# Patient Record
Sex: Female | Born: 1945 | Race: Black or African American | Hispanic: No | Smoking: Never smoker
Health system: Southern US, Community
[De-identification: ages and names within clinical notes are randomized; demographics above are authoritative.]

## PROBLEM LIST (undated history)

## (undated) DIAGNOSIS — G4733 Obstructive sleep apnea (adult) (pediatric): Secondary | ICD-10-CM

## (undated) DIAGNOSIS — R51 Headache: Secondary | ICD-10-CM

## (undated) DIAGNOSIS — G5603 Carpal tunnel syndrome, bilateral upper limbs: Secondary | ICD-10-CM

## (undated) DIAGNOSIS — G8929 Other chronic pain: Secondary | ICD-10-CM

## (undated) DIAGNOSIS — M8580 Other specified disorders of bone density and structure, unspecified site: Secondary | ICD-10-CM

## (undated) DIAGNOSIS — H5711 Ocular pain, right eye: Secondary | ICD-10-CM

## (undated) DIAGNOSIS — G4730 Sleep apnea, unspecified: Secondary | ICD-10-CM

## (undated) DIAGNOSIS — R7303 Prediabetes: Secondary | ICD-10-CM

## (undated) DIAGNOSIS — J302 Other seasonal allergic rhinitis: Secondary | ICD-10-CM

## (undated) DIAGNOSIS — I10 Essential (primary) hypertension: Secondary | ICD-10-CM

## (undated) DIAGNOSIS — E785 Hyperlipidemia, unspecified: Secondary | ICD-10-CM

## (undated) DIAGNOSIS — Z973 Presence of spectacles and contact lenses: Secondary | ICD-10-CM

## (undated) DIAGNOSIS — R519 Headache, unspecified: Secondary | ICD-10-CM

## (undated) DIAGNOSIS — E7800 Pure hypercholesterolemia, unspecified: Secondary | ICD-10-CM

## (undated) DIAGNOSIS — G500 Trigeminal neuralgia: Secondary | ICD-10-CM

## (undated) DIAGNOSIS — K589 Irritable bowel syndrome without diarrhea: Secondary | ICD-10-CM

## (undated) DIAGNOSIS — G5600 Carpal tunnel syndrome, unspecified upper limb: Secondary | ICD-10-CM

## (undated) DIAGNOSIS — K219 Gastro-esophageal reflux disease without esophagitis: Secondary | ICD-10-CM

## (undated) DIAGNOSIS — N950 Postmenopausal bleeding: Secondary | ICD-10-CM

## (undated) DIAGNOSIS — N840 Polyp of corpus uteri: Secondary | ICD-10-CM

## (undated) HISTORY — PX: COLONOSCOPY: SHX174

## (undated) HISTORY — DX: Irritable bowel syndrome, unspecified: K58.9

## (undated) HISTORY — DX: Carpal tunnel syndrome, unspecified upper limb: G56.00

## (undated) HISTORY — DX: Headache: R51

## (undated) HISTORY — DX: Other specified disorders of bone density and structure, unspecified site: M85.80

## (undated) HISTORY — DX: Gastro-esophageal reflux disease without esophagitis: K21.9

## (undated) HISTORY — DX: Ocular pain, right eye: H57.11

## (undated) HISTORY — DX: Trigeminal neuralgia: G50.0

## (undated) HISTORY — PX: CHOLECYSTECTOMY: SHX55

## (undated) HISTORY — DX: Headache, unspecified: R51.9

## (undated) HISTORY — DX: Pure hypercholesterolemia, unspecified: E78.00

## (undated) HISTORY — DX: Prediabetes: R73.03

## (undated) HISTORY — DX: Sleep apnea, unspecified: G47.30

---

## 1985-07-02 HISTORY — PX: BREAST EXCISIONAL BIOPSY: SUR124

## 1998-05-13 ENCOUNTER — Ambulatory Visit (HOSPITAL_COMMUNITY): Admission: RE | Admit: 1998-05-13 | Discharge: 1998-05-13 | Payer: Self-pay | Admitting: Gastroenterology

## 1998-08-17 ENCOUNTER — Ambulatory Visit (HOSPITAL_COMMUNITY): Admission: RE | Admit: 1998-08-17 | Discharge: 1998-08-17 | Payer: Self-pay | Admitting: Gastroenterology

## 1998-08-17 ENCOUNTER — Encounter: Payer: Self-pay | Admitting: Gastroenterology

## 1999-09-22 ENCOUNTER — Encounter: Payer: Self-pay | Admitting: Obstetrics and Gynecology

## 1999-09-22 ENCOUNTER — Encounter: Admission: RE | Admit: 1999-09-22 | Discharge: 1999-09-22 | Payer: Self-pay | Admitting: Obstetrics and Gynecology

## 2000-07-19 ENCOUNTER — Other Ambulatory Visit: Admission: RE | Admit: 2000-07-19 | Discharge: 2000-07-19 | Payer: Self-pay | Admitting: Obstetrics and Gynecology

## 2000-07-22 ENCOUNTER — Other Ambulatory Visit: Admission: RE | Admit: 2000-07-22 | Discharge: 2000-07-22 | Payer: Self-pay | Admitting: Obstetrics and Gynecology

## 2001-03-24 ENCOUNTER — Encounter: Payer: Self-pay | Admitting: Obstetrics and Gynecology

## 2001-03-24 ENCOUNTER — Encounter: Admission: RE | Admit: 2001-03-24 | Discharge: 2001-03-24 | Payer: Self-pay | Admitting: Obstetrics and Gynecology

## 2002-04-17 ENCOUNTER — Other Ambulatory Visit: Admission: RE | Admit: 2002-04-17 | Discharge: 2002-04-17 | Payer: Self-pay | Admitting: Obstetrics and Gynecology

## 2002-04-17 ENCOUNTER — Encounter: Payer: Self-pay | Admitting: Obstetrics and Gynecology

## 2002-04-17 ENCOUNTER — Encounter: Admission: RE | Admit: 2002-04-17 | Discharge: 2002-04-17 | Payer: Self-pay | Admitting: Obstetrics and Gynecology

## 2003-06-01 ENCOUNTER — Encounter: Admission: RE | Admit: 2003-06-01 | Discharge: 2003-06-01 | Payer: Self-pay | Admitting: Obstetrics and Gynecology

## 2003-06-15 ENCOUNTER — Other Ambulatory Visit: Admission: RE | Admit: 2003-06-15 | Discharge: 2003-06-15 | Payer: Self-pay | Admitting: Obstetrics and Gynecology

## 2004-04-24 ENCOUNTER — Ambulatory Visit (HOSPITAL_BASED_OUTPATIENT_CLINIC_OR_DEPARTMENT_OTHER): Admission: RE | Admit: 2004-04-24 | Discharge: 2004-04-24 | Payer: Self-pay | Admitting: Family Medicine

## 2004-05-16 ENCOUNTER — Ambulatory Visit (HOSPITAL_BASED_OUTPATIENT_CLINIC_OR_DEPARTMENT_OTHER): Admission: RE | Admit: 2004-05-16 | Discharge: 2004-05-16 | Payer: Self-pay | Admitting: Family Medicine

## 2004-06-13 ENCOUNTER — Ambulatory Visit (HOSPITAL_COMMUNITY): Admission: RE | Admit: 2004-06-13 | Discharge: 2004-06-13 | Payer: Self-pay | Admitting: Gastroenterology

## 2004-06-23 ENCOUNTER — Ambulatory Visit (HOSPITAL_COMMUNITY): Admission: RE | Admit: 2004-06-23 | Discharge: 2004-06-23 | Payer: Self-pay | Admitting: Obstetrics and Gynecology

## 2004-07-20 ENCOUNTER — Other Ambulatory Visit: Admission: RE | Admit: 2004-07-20 | Discharge: 2004-07-20 | Payer: Self-pay | Admitting: Obstetrics and Gynecology

## 2005-02-26 ENCOUNTER — Emergency Department (HOSPITAL_COMMUNITY): Admission: EM | Admit: 2005-02-26 | Discharge: 2005-02-26 | Payer: Self-pay | Admitting: Emergency Medicine

## 2005-07-12 ENCOUNTER — Ambulatory Visit (HOSPITAL_COMMUNITY): Admission: RE | Admit: 2005-07-12 | Discharge: 2005-07-12 | Payer: Self-pay | Admitting: Obstetrics and Gynecology

## 2005-10-12 ENCOUNTER — Encounter: Admission: RE | Admit: 2005-10-12 | Discharge: 2005-10-12 | Payer: Self-pay | Admitting: Family Medicine

## 2006-07-19 ENCOUNTER — Ambulatory Visit (HOSPITAL_COMMUNITY): Admission: RE | Admit: 2006-07-19 | Discharge: 2006-07-19 | Payer: Self-pay | Admitting: Obstetrics and Gynecology

## 2007-07-21 ENCOUNTER — Ambulatory Visit (HOSPITAL_COMMUNITY): Admission: RE | Admit: 2007-07-21 | Discharge: 2007-07-21 | Payer: Self-pay | Admitting: Obstetrics and Gynecology

## 2008-07-23 ENCOUNTER — Ambulatory Visit (HOSPITAL_COMMUNITY): Admission: RE | Admit: 2008-07-23 | Discharge: 2008-07-23 | Payer: Self-pay | Admitting: Obstetrics and Gynecology

## 2008-10-10 ENCOUNTER — Encounter: Admission: RE | Admit: 2008-10-10 | Discharge: 2008-10-10 | Payer: Self-pay | Admitting: Family Medicine

## 2009-08-22 ENCOUNTER — Ambulatory Visit (HOSPITAL_COMMUNITY): Admission: RE | Admit: 2009-08-22 | Discharge: 2009-08-22 | Payer: Self-pay | Admitting: Obstetrics and Gynecology

## 2010-01-09 ENCOUNTER — Observation Stay (HOSPITAL_COMMUNITY): Admission: EM | Admit: 2010-01-09 | Discharge: 2010-01-10 | Payer: Self-pay | Admitting: Emergency Medicine

## 2010-01-09 HISTORY — PX: CHOLECYSTECTOMY, LAPAROSCOPIC: SHX56

## 2010-06-28 ENCOUNTER — Other Ambulatory Visit
Admission: RE | Admit: 2010-06-28 | Discharge: 2010-06-28 | Payer: Self-pay | Source: Home / Self Care | Admitting: Family Medicine

## 2010-07-28 ENCOUNTER — Other Ambulatory Visit (HOSPITAL_COMMUNITY): Payer: Self-pay | Admitting: Family Medicine

## 2010-07-28 DIAGNOSIS — Z1231 Encounter for screening mammogram for malignant neoplasm of breast: Secondary | ICD-10-CM

## 2010-07-28 DIAGNOSIS — Z0000 Encounter for general adult medical examination without abnormal findings: Secondary | ICD-10-CM

## 2010-08-24 ENCOUNTER — Ambulatory Visit (HOSPITAL_COMMUNITY)
Admission: RE | Admit: 2010-08-24 | Discharge: 2010-08-24 | Disposition: A | Discharge status: Home / Self Care | Payer: Medicare Other | Source: Ambulatory Visit | Attending: Family Medicine | Admitting: Family Medicine

## 2010-08-24 DIAGNOSIS — Z1231 Encounter for screening mammogram for malignant neoplasm of breast: Secondary | ICD-10-CM

## 2010-09-17 LAB — URINALYSIS, ROUTINE W REFLEX MICROSCOPIC
Glucose, UA: NEGATIVE mg/dL
Nitrite: NEGATIVE
Specific Gravity, Urine: 1.023 (ref 1.005–1.030)
Urobilinogen, UA: 1 mg/dL (ref 0.0–1.0)

## 2010-09-17 LAB — URINE CULTURE

## 2010-09-17 LAB — COMPREHENSIVE METABOLIC PANEL
ALT: 14 U/L (ref 0–35)
AST: 18 U/L (ref 0–37)
Albumin: 3.6 g/dL (ref 3.5–5.2)
Alkaline Phosphatase: 73 U/L (ref 39–117)
Calcium: 9.4 mg/dL (ref 8.4–10.5)
GFR calc Af Amer: >60 mL/min (ref >60)
GFR calc non Af Amer: 56 mL/min — ABNORMAL LOW (ref >60)
Potassium: 3.4 mEq/L — ABNORMAL LOW (ref 3.5–5.1)
Sodium: 136 mEq/L (ref 135–145)
Total Bilirubin: 0.8 mg/dL (ref 0.3–1.2)
Total Protein: 7.2 g/dL (ref 6.0–8.3)

## 2010-09-17 LAB — CBC
Hemoglobin: 14.2 g/dL (ref 12.0–15.0)
MCHC: 34.8 g/dL (ref 30.0–36.0)
Platelets: 244 10*3/uL (ref 150–400)
RDW: 13.8 % (ref 11.5–15.5)

## 2010-09-17 LAB — DIFFERENTIAL
Basophils Absolute: 0 10*3/uL (ref 0.0–0.1)
Basophils Relative: 0 % (ref 0–1)
Eosinophils Absolute: 0 10*3/uL (ref 0.0–0.7)
Lymphocytes Relative: 11 % — ABNORMAL LOW (ref 12–46)

## 2010-09-17 LAB — LIPASE, BLOOD: Lipase: 23 U/L (ref 11–59)

## 2010-09-17 LAB — URINE MICROSCOPIC-ADD ON

## 2010-11-17 NOTE — Op Note (Signed)
NAMEJUANETTE, Kathryn Guerrero             ACCOUNT NO.:  [id number]   MEDICAL RECORD NO.:  [id number]          PATIENT TYPE:  AMB   LOCATION:  ENDO                         FACILITY:  [facility]   PHYSICIAN:  John C. Madilyn Fireman, M.D.    DATE OF BIRTH:  January 05, 1946   DATE OF PROCEDURE:  06/13/2004  DATE OF DISCHARGE:                                 OPERATIVE REPORT   PROCEDURE:  Esophagogastroduodenoscopy.   INDICATION FOR PROCEDURE:  Description of reflux symptoms, left upper  quadrant pain, and nausea in a patient who is also undergoing screening  colonoscopy today.   DESCRIPTION OF PROCEDURE:  The patient was placed in the left lateral  decubitus position and placed on the pulse monitor with continuous low flow  oxygen delivered by nasal cannula.  She was sedated with 50 mcg IV fentanyl  and 6 mg IV Versed.  The Olympus video endoscope was advanced under direct  vision to the oropharynx and esophagus.  The esophagus was straight and of  normal caliber.  At the squamocolumnar line at 38 cm, no visible hiatal  ring, stricture, or other abnormality of the GE junction.  The stomach was  entered and a small amount of liquid secretion was suctioned from the  fundus.  Retroflexed view of the cardia was unremarkable.  The fundus, body,  antrum, and pylorus all appeared normal.  The duodenum was entered and both  the bulb and second portion were well inspected and appeared to be within  normal limits.  The scope was withdrawn back into the stomach and a CLO test  obtained.  The scope was then withdrawn and the patient prepared for  colonoscopy.  She tolerated the procedure well and there were no immediate  complications.   IMPRESSION:  Normal study.   PLAN:  Will proceed with colonoscopy.      JCH/MEDQ  D:  06/13/2004  T:  06/13/2004  Job:  [id number]   cc:   Jethro Bastos, M.D.  [address]  [place]  [place] [address]  Fax: [phone]

## 2010-11-17 NOTE — Procedures (Signed)
Kathryn Guerrero, Kathryn Guerrero             ACCOUNT NO.:  [id number]   MEDICAL RECORD NO.:  [id number]          PATIENT TYPE:  OUT   LOCATION:  SLEEP CENTER                 FACILITY:  [facility]   PHYSICIAN:  Clinton D. Maple Hudson, M.D. DATE OF BIRTH:  October 23, 1945   DATE OF STUDY:  05/16/2004                              NOCTURNAL POLYSOMNOGRAM   REFERRING PHYSICIAN:  Jethro Bastos, M.D.   INDICATION FOR STUDY AND HISTORY:  Hypersomnia with sleep apnea.  Scheduled  for CPAP titration.  Diagnostic NPSG April 24, 2004, recorded an RDI of 36  obstructive events per hour with desaturation to 72%.   SLEEP ARCHITECTURE:  Total sleep time 342 minutes with sleep efficiency of  87%.  Stage I was 2%; stage II, 43%; Stages III and IV 19%.  REM was 35% of  total sleep time.  Latency to sleep onset 15 minutes, latency to REM 128  minutes.  Awake after sleep onset 38 minutes.  Arousal index 12.4.   RESPIRATORY DATA:  CPAP titration protocol.  CPAP was titrated to 9 CWP, RDI  3.2 per hour using a small Comfort Gel mask with heated humidifier.   OXYGEN DATA:  Mild snoring before CPAP with oxygen desaturation to a nadir  of 82%.  After CPAP titration, snoring was prevented, and oxygen saturation  held 95%.   CARDIAC DATA:  Normal sinus rhythm.   MOVEMENT AND PARASOMNIA:  Occasional leg jerk, insignificant.   IMPRESSION AND RECOMMENDATIONS:  1.  Successful CPAP titration to 9 CWP, RDI 3.2 per hour using a small      Comfort Gel mask with heated humidifier.  2.  Previously diagnosed with obstructive sleep apnea based on NPSG April 24, 2004, recording an RDI of 36 per hour with desaturation to 72%.   Clinton D. Maple Hudson, M.D.  Diplomate, Biomedical engineer of Sleep Medicine                                                           Clinton D. Maple Hudson, M.D.  Diplomate, [employer] Board   CDY/MEDQ  D:  05/21/2004 10:25:23  T:  05/21/2004 10:53:35  Job:  [id number]   cc:   Jethro Bastos, M.D.  [address]  [place]  [place] [address]  Fax: [phone]

## 2010-11-17 NOTE — Procedures (Signed)
Kathryn Guerrero, Kathryn Guerrero             ACCOUNT NO.:  [id number]   MEDICAL RECORD NO.:  [id number]          PATIENT TYPE:  OUT   LOCATION:  SLEEP CENTER                 FACILITY[facility]  [facility]   PHYSICIAN:  Clinton D. Maple Hudson, M.D. DATE OF BIRTH:  Aug 14, 1945   DATE OF STUDY:  04/24/2004                              NOCTURNAL POLYSOMNOGRAM   REFERRING PHYSICIAN:  Jethro Bastos, M.D.   INDICATION FOR STUDY AND HISTORY:  Hypersomnia with sleep apnea.   Epworth sleepiness score 11/24, neck size 14 inches, BMI 35, weight 195  pounds.   SLEEP ARCHITECTURE:  Total sleep time 361 minutes with sleep efficiency of  83%.  Stage I was 6%; stage II, 53%; Stages III and IV 13%.  REM was 28% of  total sleep time.  Latency to sleep onset 32 minutes, latency to REM 73  minutes.  Awake after sleep onset 42 minute.  Arousal index 34, which is  increased and mostly related to respiratory events.   RESPIRATORY DATA:  NPSG protocol.  RDI 36.6 per hour which indicates  moderately severe obstructive sleep apnea/hypopnea syndrome.  This included  69 obstructive apneas and 151 hypopneas.  Events were not positional.  REM  RDI  was 70 per hour.   OXYGEN DATA:  Moderate to loud snoring with severe oxygen desaturation to a  nadir of 72% with apneas.  Mean saturation otherwise was 90 to 91% on room  air.   CARDIAC DATA:  Normal cardiac rhythm.   MOVEMENT AND PARASOMNIA:  A total of 81 limb jerks were recorded of which 15  were associated with arousal or awakening for a periodic limb movement with  arousal index of 2.5 per hour which is mildly increased.   IMPRESSION AND RECOMMENDATIONS:  1.  Moderately severe obstructive sleep apnea/hypopnea syndrome, RDI 36.6      per hour with desaturation to 72%.  2.  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  3.  Consider return for CPAP titration or evaluation for alternative care if      appropriate.   Clinton D. Maple Hudson, M.D.  Diplomate, Biomedical engineer of Sleep  Medicine                                                          Clinton D. Maple Hudson, M.D.  Diplomate, [employer] Board  CDY/MEDQ  D:  04/30/2004 08:55:27  T:  04/30/2004 09:57:34  Job:  [id number]

## 2010-11-17 NOTE — Op Note (Signed)
NAMEMICKIE, Kathryn Guerrero             ACCOUNT NO.:  [id number]   MEDICAL RECORD NO.:  [id number]          PATIENT TYPE:  AMB   LOCATION:  ENDO                         FACILITY:  [facility]   PHYSICIAN:  John C. Madilyn Fireman, M.D.    DATE OF BIRTH:  May 21, 1946   DATE OF PROCEDURE:  06/13/2004  DATE OF DISCHARGE:                                 OPERATIVE REPORT   PROCEDURE:  Colonoscopy.   INDICATION FOR PROCEDURE:  Average risk colon cancer screening.   DESCRIPTION OF PROCEDURE:  The patient was placed in the left lateral  decubitus position and placed on the pulse monitor with continuous low flow  oxygen delivered by nasal cannula.  She was sedated with 37.5 mcg IV  fentanyl and 3 mg IV Versed.  The Olympus video colonoscope was inserted  into the rectum and advanced as far as possible but the scope reached only  about the mid ascending colon despite multiple position changes, torque  maneuvers, and abdominal pressure.  The ileocecal valve was seen at a  distance but the base of the cecum was not reached.  The visualized portions  of the ileocecal valve and ascending colon appeared normal, as did the  transverse, descending, sigmoid, and rectum.  The scope was then withdrawn  and the patient returned to the recovery room in stable condition.  He  tolerated the procedure well and there were no immediate complications.   IMPRESSION:  Normal study with inability to reach the base of the cecum and  view only to the ileocecal valve.   PLAN:  Will obtain Hemoccults in a few weeks.  If positive, will probably  follow up with a BE.      JCH/MEDQ  D:  06/13/2004  T:  06/13/2004  Job:  [id number]   cc:   Jethro Bastos, M.D.  [address]  [place]  [place] [address]  Fax: [phone]

## 2011-07-27 ENCOUNTER — Other Ambulatory Visit (HOSPITAL_COMMUNITY): Payer: Self-pay | Admitting: Family Medicine

## 2011-07-27 DIAGNOSIS — Z1231 Encounter for screening mammogram for malignant neoplasm of breast: Secondary | ICD-10-CM

## 2011-08-28 ENCOUNTER — Ambulatory Visit (HOSPITAL_COMMUNITY)
Admission: RE | Admit: 2011-08-28 | Discharge: 2011-08-28 | Disposition: A | Discharge status: Home / Self Care | Payer: Medicare Other | Source: Ambulatory Visit | Attending: Family Medicine | Admitting: Family Medicine

## 2011-08-28 DIAGNOSIS — Z1231 Encounter for screening mammogram for malignant neoplasm of breast: Secondary | ICD-10-CM | POA: Insufficient documentation

## 2012-02-05 ENCOUNTER — Emergency Department (HOSPITAL_COMMUNITY): Payer: Medicare Other

## 2012-02-05 ENCOUNTER — Encounter (HOSPITAL_COMMUNITY): Payer: Self-pay | Admitting: Emergency Medicine

## 2012-02-05 ENCOUNTER — Emergency Department (HOSPITAL_COMMUNITY)
Admission: EM | Admit: 2012-02-05 | Discharge: 2012-02-05 | Disposition: A | Discharge status: Home / Self Care | Payer: Medicare Other | Attending: Emergency Medicine | Admitting: Emergency Medicine

## 2012-02-05 DIAGNOSIS — I10 Essential (primary) hypertension: Secondary | ICD-10-CM | POA: Insufficient documentation

## 2012-02-05 DIAGNOSIS — Z79899 Other long term (current) drug therapy: Secondary | ICD-10-CM | POA: Insufficient documentation

## 2012-02-05 DIAGNOSIS — R42 Dizziness and giddiness: Secondary | ICD-10-CM

## 2012-02-05 DIAGNOSIS — R112 Nausea with vomiting, unspecified: Secondary | ICD-10-CM | POA: Insufficient documentation

## 2012-02-05 HISTORY — DX: Essential (primary) hypertension: I10

## 2012-02-05 LAB — URINALYSIS, ROUTINE W REFLEX MICROSCOPIC
Glucose, UA: NEGATIVE mg/dL
Specific Gravity, Urine: 1.017 (ref 1.005–1.030)
pH: 6.5 (ref 5.0–8.0)

## 2012-02-05 LAB — BASIC METABOLIC PANEL
BUN: 13 mg/dL (ref 6–23)
Calcium: 9.9 mg/dL (ref 8.4–10.5)
Chloride: 98 mEq/L (ref 96–112)
Creatinine, Ser: 0.97 mg/dL (ref 0.50–1.10)
Glucose, Bld: 135 mg/dL — ABNORMAL HIGH (ref 70–99)
Potassium: 3.5 mEq/L (ref 3.5–5.1)

## 2012-02-05 LAB — CBC WITH DIFFERENTIAL/PLATELET
Eosinophils Absolute: 0.1 10*3/uL (ref 0.0–0.7)
Eosinophils Relative: 1 % (ref 0–5)
Lymphocytes Relative: 18 % (ref 12–46)
Lymphs Abs: 2.2 10*3/uL (ref 0.7–4.0)
MCH: 31 pg (ref 26.0–34.0)
MCHC: 34.4 g/dL (ref 30.0–36.0)
MCV: 90.4 fL (ref 78.0–100.0)
Monocytes Relative: 5 % (ref 3–12)
Neutrophils Relative %: 77 % (ref 43–77)
RDW: 13.6 % (ref 11.5–15.5)
WBC: 12.1 10*3/uL — ABNORMAL HIGH (ref 4.0–10.5)

## 2012-02-05 LAB — URINE MICROSCOPIC-ADD ON

## 2012-02-05 MED ORDER — SODIUM CHLORIDE 0.9 % IV BOLUS (SEPSIS)
250.0000 mL | Freq: Once | INTRAVENOUS | Status: AC
  Administered 2012-02-05: 250 mL via INTRAVENOUS

## 2012-02-05 MED ORDER — SODIUM CHLORIDE 0.9 % IV SOLN
INTRAVENOUS | Status: DC
  Administered 2012-02-05: 20:00:00 via INTRAVENOUS

## 2012-02-05 MED ORDER — LORAZEPAM 1 MG PO TABS
1.0000 mg | ORAL_TABLET | Freq: Once | ORAL | Status: AC
  Administered 2012-02-05: 1 mg via ORAL
  Filled 2012-02-05: qty 1

## 2012-02-05 MED ORDER — MECLIZINE HCL 50 MG PO TABS: 25.0000 mg | ORAL_TABLET | Freq: Three times a day (TID) | ORAL | Status: AC | PRN

## 2012-02-05 MED ORDER — ONDANSETRON 4 MG PO TBDP
8.0000 mg | ORAL_TABLET | Freq: Once | ORAL | Status: AC
  Administered 2012-02-05: 8 mg via ORAL
  Filled 2012-02-05: qty 2

## 2012-02-05 NOTE — ED Notes (Signed)
Pt ambulated to restroom with no assistance. ?

## 2012-02-05 NOTE — ED Notes (Signed)
The pt reports she feel better.  C/o being cold blankets given room temp increased

## 2012-02-05 NOTE — ED Provider Notes (Addendum)
MSE was initiated and I personally evaluated the patient and placed orders (if any) at  4:55 PM on February 05, 2012.  The remainder of the MSE may be completed by another ED provider.  No CP now or recently. C/o dizziness, non-vertiginous. Feels, "off-balance and light-headed." No nystagmus. Normal strength. Treatment and evaluation tests ordered.  Flint Melter, MD 02/05/12 1657   Medical screening examination/treatment/procedure(s) were conducted as a shared visit with non-physician practitioner(s) and myself.  I personally evaluated the patient during the encounter  Flint Melter, MD 02/16/12 1341

## 2012-02-05 NOTE — ED Notes (Signed)
Pt c/o lightheadedness and nausea with left ear roaring and pressure x 3 days; pt noted HR 48-50bpm

## 2012-02-05 NOTE — ED Provider Notes (Deleted)
History     CSN: [id number]  Arrival date & time 02/05/12  1623   First MD Initiated Contact with Patient 02/05/12 1801      Chief Complaint  Patient presents with  . Dizziness  . Nausea    (Consider location/radiation/quality/duration/timing/severity/associated sxs/prior treatment) HPI  Past Medical History  Diagnosis Date  . Hypertension     History reviewed. No pertinent past surgical history.  History reviewed. No pertinent family history.  History  Substance Use Topics  . Smoking status: Never Smoker   . Smokeless tobacco: Not on file  . Alcohol Use: No    OB History    Grav Para Term Preterm Abortions TAB SAB Ect Mult Living                  Review of Systems  Allergies  Vicodin  Home Medications   Current Outpatient Rx  Name Route Sig Dispense Refill  . BIOTIN 1000 MCG PO TABS Oral Take 1,000 mcg by mouth daily.    Marland Kitchen CALCIUM CITRATE MALATE-VIT D PO Oral Take 1 tablet by mouth 2 (two) times daily.    . B-12 PO Oral Take 1 tablet by mouth daily.    Marland Kitchen HYDROCHLOROTHIAZIDE 25 MG PO TABS Oral Take 25 mg by mouth daily.    Marland Kitchen LOSARTAN POTASSIUM 25 MG PO TABS Oral Take 25 mg by mouth daily.    . MOMETASONE FUROATE 50 MCG/ACT NA SUSP Nasal Place 2 sprays into the nose daily.    . ADULT MULTIVITAMIN W/MINERALS CH Oral Take 1 tablet by mouth daily.    Marland Kitchen OMEPRAZOLE 20 MG PO CPDR Oral Take 20 mg by mouth daily.    Marland Kitchen POTASSIUM CHLORIDE CRYS ER 20 MEQ PO TBCR Oral Take 20 mEq by mouth daily.      BP 149/83  Pulse 69  Temp 97.8 F (36.6 C) (Oral)  Resp 18  SpO2 96%  Physical Exam  ED Course  Procedures (including critical care time)  Results for orders placed during the hospital encounter of 02/05/12  CBC WITH DIFFERENTIAL      Component Value Range   WBC 12.1 (*) 4.0 - 10.5 K/uL   RBC 4.67  3.87 - 5.11 MIL/uL   Hemoglobin 14.5  12.0 - 15.0 g/dL   HCT 95.6  21.3 - 08.6 %   MCV 90.4  78.0 - 100.0 fL   MCH 31.0  26.0 - 34.0 pg   MCHC 34.4  30.0 -  36.0 g/dL   RDW 57.8  46.9 - 62.9 %   Platelets 208  150 - 400 K/uL   Neutrophils Relative 77  43 - 77 %   Neutro Abs 9.2 (*) 1.7 - 7.7 K/uL   Lymphocytes Relative 18  12 - 46 %   Lymphs Abs 2.2  0.7 - 4.0 K/uL   Monocytes Relative 5  3 - 12 %   Monocytes Absolute 0.6  0.1 - 1.0 K/uL   Eosinophils Relative 1  0 - 5 %   Eosinophils Absolute 0.1  0.0 - 0.7 K/uL   Basophils Relative 0  0 - 1 %   Basophils Absolute 0.0  0.0 - 0.1 K/uL  BASIC METABOLIC PANEL      Component Value Range   Sodium 137  135 - 145 mEq/L   Potassium 3.5  3.5 - 5.1 mEq/L   Chloride 98  96 - 112 mEq/L   CO2 28  19 - 32 mEq/L   Glucose, Bld 135 (*)  70 - 99 mg/dL   BUN 13  6 - 23 mg/dL   Creatinine, Ser 1.30  0.50 - 1.10 mg/dL   Calcium 9.9  8.4 - 86.5 mg/dL   GFR calc non Af Amer 60 (*) >90 mL/min   GFR calc Af Amer 69 (*) >90 mL/min  TROPONIN I      Component Value Range   Troponin I <0.30  <0.30 ng/mL     Dg Chest 2 View  02/05/2012  *RADIOLOGY REPORT*  Clinical Data: Dizziness, nausea for 3 days  CHEST - 2 VIEW  Comparison: None  Findings: The lungs are clear.  Mediastinal contours appear normal. The heart is within upper limits of normal.  No acute bony abnormality is seen with only mild degenerative change in the thoracic spine.  IMPRESSION: No active lung disease.  Original Report Authenticated By: Juline Patch, M.D.   7:16 PM BP 149/83  Pulse 69  Temp 97.8 F (36.6 C) (Oral)  Resp 18  SpO2 96% Patient seen and evaluated. Will order orthostatic VS.  Patient is neurologically intact. No nystagmus, papilledema , or HA indicating intracranial etiology.  Elevated white count.  Patient with abnormal EKG-  First troponin negative.  I believe patients dizziness is related to  [facility] ear dsyfunction.    7:44 PM  Orthostatics are negative.  Patient seen and evaluated. States that she is feeling a bit dizzy again, no nausea.  I will have her get her second bolus and have her ambulate.  If she is stable I  will d/c with anti nausea meds, and have her follow up with ENT.   Patient states that she is no longer dizzy or nauseated.  Patient's vitals are stable and she is able to ambulate unassisted. i will d/c patient home with ENT follow up. BP 143/104  Pulse 56  Temp 97.8 F (36.6 C) (Oral)  Resp 19  SpO2 99%   1. Episodic lightheadedness   2. Nausea & vomiting       MDM  D/C patient home with antivert and ENT follow up. Discussed reasons to seek immediate care. Patient expresses understanding and agrees with plan.         Arthor Captain, PA-C 02/06/12 1331

## 2012-02-05 NOTE — ED Notes (Signed)
Pt sitting up in bed, A&Ox4, talking with pharmacy tech, warm skin, unlabored and equal respirations.  Pt reports she has been feeling lightheaded and nauseous intermittently since Sunday. Pt also c/o a constant "roaring" sound and pressure in left ear since Sunday.

## 2012-02-06 ENCOUNTER — Telehealth (HOSPITAL_COMMUNITY): Payer: Self-pay | Admitting: *Deleted

## 2012-02-06 NOTE — ED Provider Notes (Signed)
This is an addendum to my Note on Kathryn Guerrero In reviewing her chart this morning I realized that I may have missed a possible UTI reflected in UA with leukocytosis and HGb Postitve, rare bacteria and nitrite negative.  CBC with mild leukocytosis and elevated ANC. Patient was without urinary complaints and no AMS.  I spoke with Kenney Houseman the Nurse flow manager this afternoon (02/06/2012) and had the ED contact her do follow up with her PCP today to get a UA and culture.  I did not prescribe abx b/c I would not have the culture and sensitivity to follow up on. I Felt this would be better handled by her PCP. Patient was also infoermed that she may return to the ED. Arthor Captain. 2:02 PM   Arthor Captain, PA-C 02/06/12 1402

## 2012-02-06 NOTE — ED Provider Notes (Deleted)
History     CSN: [id number]  Arrival date & time 02/05/12  1623   First MD Initiated Contact with Patient 02/05/12 1801      Chief Complaint  Patient presents with  . Dizziness  . Nausea    (Consider location/radiation/quality/duration/timing/severity/associated sxs/prior treatment) The history is provided by the patient. No language interpreter was used.    Past Medical History  Diagnosis Date  . Hypertension     History reviewed. No pertinent past surgical history.  History reviewed. No pertinent family history.  History  Substance Use Topics  . Smoking status: Never Smoker   . Smokeless tobacco: Not on file  . Alcohol Use: No    OB History    Grav Para Term Preterm Abortions TAB SAB Ect Mult Living                  Review of Systems  Constitutional: Negative for chills.    Allergies  Vicodin  Home Medications   Current Outpatient Rx  Name Route Sig Dispense Refill  . BIOTIN 1000 MCG PO TABS Oral Take 1,000 mcg by mouth daily.    Marland Kitchen CALCIUM CITRATE MALATE-VIT D PO Oral Take 1 tablet by mouth 2 (two) times daily.    . B-12 PO Oral Take 1 tablet by mouth daily.    Marland Kitchen HYDROCHLOROTHIAZIDE 25 MG PO TABS Oral Take 25 mg by mouth daily.    Marland Kitchen LOSARTAN POTASSIUM 25 MG PO TABS Oral Take 25 mg by mouth daily.    . MOMETASONE FUROATE 50 MCG/ACT NA SUSP Nasal Place 2 sprays into the nose daily.    . ADULT MULTIVITAMIN W/MINERALS CH Oral Take 1 tablet by mouth daily.    Marland Kitchen OMEPRAZOLE 20 MG PO CPDR Oral Take 20 mg by mouth daily.    Marland Kitchen POTASSIUM CHLORIDE CRYS ER 20 MEQ PO TBCR Oral Take 20 mEq by mouth daily.      BP 149/83  Pulse 69  Temp 97.8 F (36.6 C) (Oral)  Resp 18  SpO2 96%  Physical Exam  ED Course  Procedures (including critical care time)  Results for orders placed during the hospital encounter of 02/05/12  CBC WITH DIFFERENTIAL      Component Value Range   WBC 12.1 (*) 4.0 - 10.5 K/uL   RBC 4.67  3.87 - 5.11 MIL/uL   Hemoglobin 14.5  12.0 -  15.0 g/dL   HCT 08.6  57.8 - 46.9 %   MCV 90.4  78.0 - 100.0 fL   MCH 31.0  26.0 - 34.0 pg   MCHC 34.4  30.0 - 36.0 g/dL   RDW 62.9  52.8 - 41.3 %   Platelets 208  150 - 400 K/uL   Neutrophils Relative 77  43 - 77 %   Neutro Abs 9.2 (*) 1.7 - 7.7 K/uL   Lymphocytes Relative 18  12 - 46 %   Lymphs Abs 2.2  0.7 - 4.0 K/uL   Monocytes Relative 5  3 - 12 %   Monocytes Absolute 0.6  0.1 - 1.0 K/uL   Eosinophils Relative 1  0 - 5 %   Eosinophils Absolute 0.1  0.0 - 0.7 K/uL   Basophils Relative 0  0 - 1 %   Basophils Absolute 0.0  0.0 - 0.1 K/uL  BASIC METABOLIC PANEL      Component Value Range   Sodium 137  135 - 145 mEq/L   Potassium 3.5  3.5 - 5.1 mEq/L   Chloride 98  96 - 112 mEq/L   CO2 28  19 - 32 mEq/L   Glucose, Bld 135 (*) 70 - 99 mg/dL   BUN 13  6 - 23 mg/dL   Creatinine, Ser 6.29  0.50 - 1.10 mg/dL   Calcium 9.9  8.4 - 52.8 mg/dL   GFR calc non Af Amer 60 (*) >90 mL/min   GFR calc Af Amer 69 (*) >90 mL/min  TROPONIN I      Component Value Range   Troponin I <0.30  <0.30 ng/mL     Dg Chest 2 View  02/05/2012  *RADIOLOGY REPORT*  Clinical Data: Dizziness, nausea for 3 days  CHEST - 2 VIEW  Comparison: None  Findings: The lungs are clear.  Mediastinal contours appear normal. The heart is within upper limits of normal.  No acute bony abnormality is seen with only mild degenerative change in the thoracic spine.  IMPRESSION: No active lung disease.  Original Report Authenticated By: Juline Patch, M.D.   7:16 PM BP 149/83  Pulse 69  Temp 97.8 F (36.6 C) (Oral)  Resp 18  SpO2 96% Patient seen and evaluated. Will order orthostatic VS.  Patient is neurologically intact. No nystagmus, papilledema , or HA indicating intracranial etiology.  Elevated white count.  Patient with abnormal EKG-  First troponin negative.  I believe patients dizziness is related to  [facility] ear dsyfunction.    7:44 PM  Orthostatics are negative.  Patient seen and evaluated. States that she is feeling  a bit dizzy again, no nausea.  I will have her get her second bolus and have her ambulate.  If she is stable I will d/c with anti nausea meds, and have her follow up with ENT.   Patient states that she is no longer dizzy or nauseated.  Patient's vitals are stable and she is able to ambulate unassisted. i will d/c patient home with ENT follow up. BP 143/104  Pulse 56  Temp 97.8 F (36.6 C) (Oral)  Resp 19  SpO2 99%   1. Episodic lightheadedness   2. Nausea & vomiting       MDM  D/C patient home with meclizine and ENT follow up. Discussed reasons to seek immediate care. Patient expresses understanding and agrees with plan.         Arthor Captain, PA-C 02/06/12 1330

## 2012-02-10 NOTE — ED Provider Notes (Signed)
History     CSN: [id number]  Arrival date & time 02/05/12  1623   First MD Initiated Contact with Patient 02/05/12 1801      Chief Complaint  Patient presents with  . Dizziness  . Nausea    (Consider location/radiation/quality/duration/timing/severity/associated sxs/prior treatment) HPI Comments: Kathryn Guerrero 66 y.o. female   The chief complaint is: Patient presents with:   Dizziness   Nausea    Patient states that  Sunday,4 days ago she was gardening and had sudden onset of high pitched sound, roaring and fullness in the left ear. Roaring remained for the next 4 days without the other symptoms. Saturday, 3 days ago she had an episode of sudden onset Dizziness with nausea and vomitting.  This lasted approximately one hour. She went to sleep and awoke without symptoms.  These episodes occurred several times over the next 3 days without associated provocation. Patient describes the dizziness as feeling as "feeling like I am going to pass out." and denies sxs of vertigo.  She denies any similar past history. Denies fevers, chills, myalgias, arthralgias, diarrhea. Denies urinary symptoms.She denies palpitations, CP, SOB, orthopnea, recent viral illness, dehydration or history of anemia. She denies any melena or hematochezia.    The history is provided by the patient. No language interpreter was used.    Past Medical History  Diagnosis Date  . Hypertension     History reviewed. No pertinent past surgical history.  History reviewed. No pertinent family history.  History  Substance Use Topics  . Smoking status: Never Smoker   . Smokeless tobacco: Not on file  . Alcohol Use: No    OB History    Grav Para Term Preterm Abortions TAB SAB Ect Mult Living                  Review of Systems  Constitutional: Negative for fever, chills, diaphoresis and fatigue.  HENT: Positive for tinnitus. Negative for hearing loss, nosebleeds, dental problem and sinus pressure.     Respiratory: Negative for cough, chest tightness and shortness of breath.   Cardiovascular: Negative for chest pain, palpitations and leg swelling.  Gastrointestinal: Positive for nausea and vomiting. Negative for abdominal pain, diarrhea and constipation.  Genitourinary: Negative for dysuria, urgency, frequency and flank pain.  Musculoskeletal: Negative for myalgias and arthralgias.  Neurological: Positive for light-headedness. Negative for weakness.    Allergies  Vicodin  Home Medications   Current Outpatient Rx  Name Route Sig Dispense Refill  . BIOTIN 1000 MCG PO TABS Oral Take 1,000 mcg by mouth daily.    Marland Kitchen CALCIUM CITRATE MALATE-VIT D PO Oral Take 1 tablet by mouth 2 (two) times daily.    . B-12 PO Oral Take 1 tablet by mouth daily.    Marland Kitchen HYDROCHLOROTHIAZIDE 25 MG PO TABS Oral Take 25 mg by mouth daily.    Marland Kitchen LOSARTAN POTASSIUM 25 MG PO TABS Oral Take 25 mg by mouth daily.    . MOMETASONE FUROATE 50 MCG/ACT NA SUSP Nasal Place 2 sprays into the nose daily.    . ADULT MULTIVITAMIN W/MINERALS CH Oral Take 1 tablet by mouth daily.    Marland Kitchen OMEPRAZOLE 20 MG PO CPDR Oral Take 20 mg by mouth daily.    Marland Kitchen POTASSIUM CHLORIDE CRYS ER 20 MEQ PO TBCR Oral Take 20 mEq by mouth daily.      BP 149/83  Pulse 69  Temp 97.8 F (36.6 C) (Oral)  Resp 18  SpO2 96%  Physical Exam  Nursing note and vitals reviewed. Constitutional: She is oriented to person, place, and time. She appears well-developed and well-nourished. No distress.  HENT:  Head: Normocephalic and atraumatic.  Eyes: Conjunctivae are normal. No scleral icterus.  Neck: Normal range of motion.  Cardiovascular: Normal rate, regular rhythm and normal heart sounds.  Exam reveals no gallop and no friction rub.   No murmur heard. Pulmonary/Chest: Effort normal and breath sounds normal. No respiratory distress.  Abdominal: Soft. Bowel sounds are normal. She exhibits no distension and no mass. There is no tenderness. There is no  guarding.  Musculoskeletal: Normal range of motion. She exhibits no edema and no tenderness.  Neurological: She is alert and oriented to person, place, and time. She displays normal reflexes. No cranial nerve deficit. She exhibits normal muscle tone. Coordination normal.  Skin: Skin is warm and dry. She is not diaphoretic.    ED Course  Procedures (including critical care time)  Results for orders placed during the hospital encounter of 02/05/12  CBC WITH DIFFERENTIAL      Component Value Range   WBC 12.1 (*) 4.0 - 10.5 K/uL   RBC 4.67  3.87 - 5.11 MIL/uL   Hemoglobin 14.5  12.0 - 15.0 g/dL   HCT 16.1  09.6 - 04.5 %   MCV 90.4  78.0 - 100.0 fL   MCH 31.0  26.0 - 34.0 pg   MCHC 34.4  30.0 - 36.0 g/dL   RDW 40.9  81.1 - 91.4 %   Platelets 208  150 - 400 K/uL   Neutrophils Relative 77  43 - 77 %   Neutro Abs 9.2 (*) 1.7 - 7.7 K/uL   Lymphocytes Relative 18  12 - 46 %   Lymphs Abs 2.2  0.7 - 4.0 K/uL   Monocytes Relative 5  3 - 12 %   Monocytes Absolute 0.6  0.1 - 1.0 K/uL   Eosinophils Relative 1  0 - 5 %   Eosinophils Absolute 0.1  0.0 - 0.7 K/uL   Basophils Relative 0  0 - 1 %   Basophils Absolute 0.0  0.0 - 0.1 K/uL  BASIC METABOLIC PANEL      Component Value Range   Sodium 137  135 - 145 mEq/L   Potassium 3.5  3.5 - 5.1 mEq/L   Chloride 98  96 - 112 mEq/L   CO2 28  19 - 32 mEq/L   Glucose, Bld 135 (*) 70 - 99 mg/dL   BUN 13  6 - 23 mg/dL   Creatinine, Ser 7.82  0.50 - 1.10 mg/dL   Calcium 9.9  8.4 - 95.6 mg/dL   GFR calc non Af Amer 60 (*) >90 mL/min   GFR calc Af Amer 69 (*) >90 mL/min  TROPONIN I      Component Value Range   Troponin I <0.30  <0.30 ng/mL     Dg Chest 2 View  02/05/2012  *RADIOLOGY REPORT*  Clinical Data: Dizziness, nausea for 3 days  CHEST - 2 VIEW  Comparison: None  Findings: The lungs are clear.  Mediastinal contours appear normal. The heart is within upper limits of normal.  No acute bony abnormality is seen with only mild degenerative change in  the thoracic spine.  IMPRESSION: No active lung disease.  Original Report Authenticated By: Juline Patch, M.D.   7:16 PM BP 149/83  Pulse 69  Temp 97.8 F (36.6 C) (Oral)  Resp 18  SpO2 96% Patient seen and evaluated. Will order orthostatic  VS.  Patient is neurologically intact. No nystagmus, papilledema , or HA indicating intracranial etiology.  Elevated white count.  Patient with abnormal EKG-  First troponin negative.  I believe patients dizziness is related to  [facility] ear dsyfunction.    7:44 PM  Orthostatics are negative.  Patient seen and evaluated. States that she is feeling a bit dizzy again, no nausea.  I will have her get her second bolus and have her ambulate.  If she is stable I will d/c with anti nausea meds, and have her follow up with ENT.   Patient states that she is no longer dizzy or nauseated.  Patient's vitals are stable and she is able to ambulate unassisted. i will d/c patient home with ENT follow up. BP 143/104  Pulse 56  Temp 97.8 F (36.6 C) (Oral)  Resp 19  SpO2 99%   1. Episodic lightheadedness   2. Nausea & vomiting       MDM  D/C patient home with antivert and ENT follow up. Discussed reasons to seek immediate care. Patient expresses understanding and agrees with plan.         Arthor Captain, PA-C 02/06/12 [id number] PA-C 02/11/12 [phone]

## 2012-09-04 ENCOUNTER — Other Ambulatory Visit (HOSPITAL_COMMUNITY): Payer: Self-pay | Admitting: Family Medicine

## 2012-09-04 DIAGNOSIS — Z1231 Encounter for screening mammogram for malignant neoplasm of breast: Secondary | ICD-10-CM

## 2012-09-18 ENCOUNTER — Ambulatory Visit (HOSPITAL_COMMUNITY)
Admission: RE | Admit: 2012-09-18 | Discharge: 2012-09-18 | Disposition: A | Discharge status: Home / Self Care | Payer: Medicare Other | Source: Ambulatory Visit | Attending: Family Medicine | Admitting: Family Medicine

## 2012-09-18 DIAGNOSIS — Z1231 Encounter for screening mammogram for malignant neoplasm of breast: Secondary | ICD-10-CM | POA: Insufficient documentation

## 2012-10-29 ENCOUNTER — Encounter: Payer: Self-pay | Admitting: Neurology

## 2012-10-29 ENCOUNTER — Ambulatory Visit
Admission: RE | Admit: 2012-10-29 | Discharge: 2012-10-29 | Disposition: A | Discharge status: Home / Self Care | Payer: Medicare Other | Source: Ambulatory Visit | Attending: Neurology | Admitting: Neurology

## 2012-10-29 ENCOUNTER — Ambulatory Visit (INDEPENDENT_AMBULATORY_CARE_PROVIDER_SITE_OTHER): Payer: Medicare Other | Admitting: Neurology

## 2012-10-29 VITALS — BP 148/84 | HR 88 | Ht 62.0 in | Wt 198.0 lb

## 2012-10-29 DIAGNOSIS — R51 Headache: Secondary | ICD-10-CM

## 2012-10-29 DIAGNOSIS — I10 Essential (primary) hypertension: Secondary | ICD-10-CM

## 2012-10-29 DIAGNOSIS — R519 Headache, unspecified: Secondary | ICD-10-CM

## 2012-10-29 NOTE — Progress Notes (Signed)
53y RH AAF referred by her primary care and dentist Dr. Pola Corn for evaluation of facial pain.   Since February 2014, she noticed when she brushing her teeth, the right canine, first premola felt sensitive, she was evaluated by her dentist, there was no pathology found, Over the next 2 months, right upper jaw sensitivity gradually getting worse, she had diffuse right facial, right sided headaches, pressure, she was given prescription of gabapentin, which has helped her pain, but she still had shooting pain from right upper jaw to her right inner eye corner, triggered by eating, biting,She has frequent tearing, right facial swelling, She denied sensory loss no hearing change , her right eye looks swollen and smaller   PHYSICAL EXAMINATOINS:  Generalized: In no acute distress  Neck: Supple, no carotid bruits   Cardiac: Regular rate rhythm  Pulmonary: Clear to auscultation bilaterally  Musculoskeletal: No deformity  Neurological examination  Mentation: Alert oriented to time, place, history taking, and causual conversation  Cranial nerve II-XII: Pupils were equal round reactive to light extraocular movements were full, visual field were full on confrontational test. facial sensation and strength were normal.SHE RIGHT CHEEK, EYE SWELLING, TENDERNESS OF RIGHT MAXILLARY AREA, and the right lacriminal gland UPON DEEP PALPITATION,  Corneal reflexes were present and normal.  hearing was intact to finger rubbing bilaterally. Uvula tongue midline.  head turning and shoulder shrug and were normal and symmetric.Tongue protrusion into cheek strength was normal. Right facial pain and swelling,   Motor: normal tone, bulk and strength.  Sensory: Intact to fine touch, pinprick, preserved vibratory sensation, and proprioception at toes.  Coordination: Normal finger to nose, heel-to-shin bilaterally there was no truncal ataxia  Gait: Rising up from seated position without assistance, normal  stance, without trunk ataxia, moderate stride, good arm swing, smooth turning, able to perform tiptoe, and heel walking without difficulty.   Romberg signs: Negative  Deep tendon reflexes: Brachioradialis 2/2, biceps 2/2, triceps 2/2, patellar 2/2, Achilles 2/2, plantar responses were flexor bilaterally.  Assessment and plan: 67 years old right-handed Caucasian female, presenting with right facial swelling, pain, since February 2014, there is tenderness at right maxillary region upon deep palpation,  1, stat CT sinus to rule out space occupying lesion at the right maxillary sinus 2. I will call her report 3. return to clinic in 3 weeks continue gabapentin,

## 2012-10-29 NOTE — Progress Notes (Signed)
Quick Note:  Please call her Ct maxillary sinus findings, enlarged right lacriminal gland, I will order MRI right orbit w/wo contrast. ______

## 2012-11-03 ENCOUNTER — Telehealth: Payer: Self-pay | Admitting: Neurology

## 2012-11-04 ENCOUNTER — Telehealth: Payer: Self-pay | Admitting: *Deleted

## 2012-11-04 NOTE — Telephone Encounter (Signed)
I called and spoke with the patient concerning her CT results. Per Dr. Zannie Cove note: Ct maxillary sinus findings, enlarged right lacriminal gland, I will order MRI right orbit w/wo contrast.

## 2012-11-04 NOTE — Telephone Encounter (Signed)
I called and spoke with the patient concerning her MRI scan. I informed the patient that our MRI dept. will call her once the scan is authorizated sometime this week.

## 2012-11-14 ENCOUNTER — Ambulatory Visit
Admission: RE | Admit: 2012-11-14 | Discharge: 2012-11-14 | Disposition: A | Discharge status: Home / Self Care | Payer: Medicare Other | Source: Ambulatory Visit | Attending: Neurology | Admitting: Neurology

## 2012-11-14 DIAGNOSIS — I10 Essential (primary) hypertension: Secondary | ICD-10-CM

## 2012-11-14 DIAGNOSIS — R51 Headache: Secondary | ICD-10-CM

## 2012-11-14 DIAGNOSIS — R519 Headache, unspecified: Secondary | ICD-10-CM

## 2012-11-14 MED ORDER — GADOBENATE DIMEGLUMINE 529 MG/ML IV SOLN
18.0000 mL | Freq: Once | INTRAVENOUS | Status: AC | PRN
  Administered 2012-11-14: 18 mL via INTRAVENOUS

## 2012-11-26 ENCOUNTER — Telehealth: Payer: Self-pay | Admitting: Neurology

## 2012-11-26 NOTE — Telephone Encounter (Signed)
I have left message to her cell and home for MRI report.

## 2012-11-26 NOTE — Telephone Encounter (Signed)
I have called her, she still has a lot of tearing from her right eye, also complains of right facial burning pain, she is taking gabapentin 300mg  tid, which takes away the edge.  1. I have suggested her hot compression of right face. 2. Keep follow up with her ophthamologist.

## 2012-11-27 ENCOUNTER — Ambulatory Visit: Payer: Medicare Other | Admitting: Neurology

## 2012-11-27 ENCOUNTER — Encounter: Payer: Self-pay | Admitting: Neurology

## 2012-11-27 ENCOUNTER — Ambulatory Visit (INDEPENDENT_AMBULATORY_CARE_PROVIDER_SITE_OTHER): Payer: Medicare Other | Admitting: Neurology

## 2012-11-27 VITALS — BP 136/70 | HR 78 | Ht 62.0 in

## 2012-11-27 DIAGNOSIS — H5710 Ocular pain, unspecified eye: Secondary | ICD-10-CM

## 2012-11-27 DIAGNOSIS — H5711 Ocular pain, right eye: Secondary | ICD-10-CM | POA: Insufficient documentation

## 2012-11-27 NOTE — Progress Notes (Signed)
28y RH AAF referred by her primary care and dentist Dr. Pola Corn for evaluation of facial pain.  I saw her initially in April 2010, she has PMHX of HTN, OSA, on CPAP For 5 years, presenting with right facial pain since Feb 20th 2010  When she washed her face, she noticed prickly sensation along right cheek, upper lip, gum line, occasionally right upper eye lid, full blow over next few days, constant burning sharp pain in the same distribution. was treated with lyrica up to 50mg  tid, partiallly helped. Pain become intermittent.  Triggered by chewing, brushing teeth, quick positional change, talking, yawn.  Tegretol 100mg  2 tabs bid since April 9th 2010, works better, still have 4/10 shooting pain with right facial movement, from cheek to inner eye corner, could not bite with right frontal teeth, hypersentive brushing right upper gum line, upper lip.  MRI brain with /wo was normal in 2010. Her right facial pain improved after right upper incision root canal.  Since February 2014 this year, she noticed when she brushing her teeth, the right canine, first premola felt sensitive, she was evaluated by her dentist, there was no pathology found.  Over the next 2 months, especially since April 2014,  right upper jaw sensitivity gradually getting worse, she had diffuse right facial, right sided headaches, pressure, she was given prescription of gabapentin, which has helped her pain, but she still had shooting pain from right upper jaw to her right inner eye corner, triggered by eating, biting,She has frequent tearing, right facial swelling,  She denied sensory loss no hearing change , her right eye looks swollen and smaller, tear a lot.  UPDATE May 29th 2014:  Her right facial pain is a little bit better. The worst pain is at night time, 8pm or later, she slept with her head popped up, which seems to help her some, shooting pain from right upper cheek run through her right eye,    She denies right  vision loss, no fever, if she bites with her teeth or if she moves her right cheek smiling, worsening right facial pain. She has frequent tearing from the right side,  CT sinus showed and right orbit showed mild enlarged right lacrimal gland, no significant pathology found  Gabapentin 300 mg 2 tablets every night helps her pain, but she complains of morning time dizziness, drug out sensation, I have reviewed her driver license from [id number], she has mild facial asymmetry even then,     PHYSICAL EXAMINATOINS:  Generalized: In no acute distress  Neck: Supple, no carotid bruits   Cardiac: Regular rate rhythm  Pulmonary: Clear to auscultation bilaterally  Musculoskeletal: No deformity  Neurological examination  Mentation: Alert oriented to time, place, history taking, and causual conversation  Cranial nerve II-XII: Pupils were equal round reactive to light extraocular movements were full, visual field were full on confrontational test. facial sensation and strength were normal.SHE RIGHT CHEEK, EYE SWELLING, TENDERNESS OF RIGHT MAXILLARY AREA, and the right lacriminal gland UPON DEEP PALPITATION,   Corneal reflexes were present and normal.  hearing was intact to finger rubbing bilaterally. Uvula tongue midline.  head turning and shoulder shrug and were normal and symmetric.Tongue protrusion into cheek strength was normal. Right facial pain and swelling,   Motor: normal tone, bulk and strength.  Sensory: Intact to fine touch, pinprick, preserved vibratory sensation, and proprioception at toes.  Coordination: Normal finger to nose, heel-to-shin bilaterally there was no truncal ataxia  Gait: Rising up from seated position without assistance,  normal stance, without trunk ataxia, moderate stride, good arm swing, smooth turning, able to perform tiptoe, and heel walking without difficulty.   Romberg signs: Negative  Deep tendon reflexes: Brachioradialis 2/2, biceps 2/2, triceps 2/2, patellar  2/2, Achilles 2/2, plantar responses were flexor bilaterally.  Assessment and plan: 67 years old right-handed Caucasian female, presenting with right facial swelling, pain, since February 2014, previous right trigeminal neuralgia, no significant pathology found on CT sinus, MRI of right orbit, previous MRI of the brain,  1. Most consistent with of right trigeminal neuralgia 2. I have given her sample of oxtella, Lyrica, for pain control, she will call back for rx. 3. RTC in one month

## 2013-01-15 ENCOUNTER — Ambulatory Visit (INDEPENDENT_AMBULATORY_CARE_PROVIDER_SITE_OTHER): Payer: Self-pay | Admitting: Neurology

## 2013-01-15 ENCOUNTER — Encounter: Payer: Self-pay | Admitting: Neurology

## 2013-01-15 VITALS — BP 148/87 | HR 86 | Ht 62.0 in | Wt 198.5 lb

## 2013-01-15 DIAGNOSIS — H5711 Ocular pain, right eye: Secondary | ICD-10-CM

## 2013-01-15 DIAGNOSIS — I10 Essential (primary) hypertension: Secondary | ICD-10-CM

## 2013-01-15 DIAGNOSIS — R519 Headache, unspecified: Secondary | ICD-10-CM

## 2013-01-15 DIAGNOSIS — R51 Headache: Secondary | ICD-10-CM

## 2013-01-15 DIAGNOSIS — H5710 Ocular pain, unspecified eye: Secondary | ICD-10-CM

## 2013-01-15 MED ORDER — GABAPENTIN 800 MG PO TABS: 800.0000 mg | ORAL_TABLET | Freq: Three times a day (TID) | ORAL | Status: DC

## 2013-01-15 NOTE — Progress Notes (Signed)
History of Present Illness:  [name], 67 yo RH AAF with PMHX of HTN, OSA, on CPAP for 5 years, returns for follow-up for  right facial pain.  She complains of right facial pain since 2010,  she noticed prickly sensation along right cheek, upper lip, gum line, occasionally right upper eye lid, full blown for several  days, constant burning sharp pain in the same distribution. She was treated with lyrica up to 50mg  tid, partiallly helped. Pain became intermittent.Triggered by chewing, brushing teeth, sudden positional change, talking, yawning.   She has also had a root canal over her front tooth, without helping her symptoms.    MRI brain with /wo was normal.  Over the years, we have tried different medications, liver, without helping her symptoms much, carbamazepine, 200 mg twice a day causing her daytime drowsiness, balance issues, she is not taking gabapentin 600 mg 3 times a day, she tolerates the medication well, reported moderate improvement of her right facial pain.  But she continued to have right facial pain about 50% of her awaking daytime, shooting pain from right gum to her right eye, burning sensation, watering right eye, burning can happen anytime of the day, lasting one hour, 8 out of 10,    she denies visual change, there is no hearing change .   Review of Systems  Out of a complete 12 system review all are negative except: :  eye pain, right facial pain,   Physical Exam  General: well developed, well  nourished, seated, in no evident distress Head: head  normocephalic and atraumatic.  Oropharynx benign. Neck: supple with no carotid or supraclavicular bruits. Cardiovascular: regular rate and rhythm, no murmurs  Neurologic Exam  Mental Status: Awake and fully alert.  Oriented to place and time.  Recent and remote memory intact.  Attention span, concentration, and fund of knowledge appropriate.  Mood and affect appropriate. Cranial Nerves:  Pupils equal, briskly reactive to  light.  Extraocular movements full without nystagmus.   bilateral corneal reflexes were normal and symmetric, Visual fields full to confrontation.  Hearing intact and symmetric to finger snap.  Facial sensation intact.  Face, tongue, palate move normally and symmetrically.  No hypersensitivity noted in the teeth or the upper lip.  Motor: Normal bulk and tone.  Normal strength in all tested extremity muscles. No tremor. Sensory: Intact to touch and temperature, pinprick and vibratory in all extremities. Coordination: Rapid alternating movements normal in all extremities.  Finger-to-nose and heel-to-shin performed accurately bilaterally. Gait and Station: Arises from chair without difficulty.  Stance is normal.  Gait demonstrates normal stride length and balance.  Able to heel, toe, and tandem walk without difficulty. Reflexes: 2+ and symmetric.  Toes downgoing.   Assessment  and plan:   67 years old [place] American female, with past medical history of right facial pain, consistent with trigeminal neuralgia.  1 increase gabapentin to 800 mg 3 times a day, 2 if she continues to be symptomatic, may consider Trileptal 3 return to clinic in 6 months with Kathryn Guerrero.  Marland Kitchen

## 2013-02-04 ENCOUNTER — Other Ambulatory Visit: Payer: Self-pay

## 2013-05-07 ENCOUNTER — Other Ambulatory Visit: Payer: Self-pay

## 2013-07-20 ENCOUNTER — Ambulatory Visit: Payer: Medicare Other | Admitting: Neurology

## 2013-07-27 ENCOUNTER — Ambulatory Visit (INDEPENDENT_AMBULATORY_CARE_PROVIDER_SITE_OTHER): Payer: Medicare Other | Admitting: Neurology

## 2013-07-27 ENCOUNTER — Encounter: Payer: Self-pay | Admitting: Neurology

## 2013-07-27 VITALS — BP 128/78 | HR 78 | Ht 62.0 in | Wt 195.0 lb

## 2013-07-27 DIAGNOSIS — H5710 Ocular pain, unspecified eye: Secondary | ICD-10-CM

## 2013-07-27 DIAGNOSIS — H5711 Ocular pain, right eye: Secondary | ICD-10-CM

## 2013-07-27 DIAGNOSIS — I10 Essential (primary) hypertension: Secondary | ICD-10-CM

## 2013-07-27 DIAGNOSIS — R51 Headache: Secondary | ICD-10-CM

## 2013-07-27 DIAGNOSIS — R519 Headache, unspecified: Secondary | ICD-10-CM

## 2013-07-27 MED ORDER — GABAPENTIN 300 MG PO CAPS: 900.0000 mg | ORAL_CAPSULE | Freq: Three times a day (TID) | ORAL | Status: DC

## 2013-07-27 NOTE — Progress Notes (Signed)
History of Present Illness:  [name], 68 yo RH AAF with PMHX of HTN, OSA, on CPAP for 5 years, returns for follow-up for  right facial pain.  She complains of right facial pain since 2010,  she noticed prickly sensation along right cheek, upper lip, gum line, occasionally right upper eye lid, full blown for several  days, constant burning sharp pain in the same distribution. She was treated with lyrica up to 50mg  tid, partiallly helped. Pain became intermittent.Triggered by chewing, brushing teeth, sudden positional change, talking, yawning.   She has also had a root canal over her front tooth, without helping her symptoms.    MRI brain with /wo was normal.  Over the years, we have tried different medications, liver, without helping her symptoms much, carbamazepine, 200 mg twice a day causing her daytime drowsiness, balance issues, she is not taking gabapentin 600 mg 3 times a day, she tolerates the medication well, reported moderate improvement of her right facial pain.  But she continued to have right facial pain about 50% of her awaking daytime, shooting pain from right gum to her right eye, burning sensation, watering right eye, burning can happen anytime of the day, lasting one hour, 8 out of 10,    she denies visual change, there is no hearing change .  UPDATE Jan 26th 2015; She had 2 episode of right facial pain over past 5 years, each episode lasts about 8-9 months, she has not taking gabapentin 800 mg 3 times a day, right facial pain has almost disappeared, there was no significant side effect   Review of Systems  Out of a complete 12 system review all are negative except: :  eye pain, right facial pain,   Physical Exam  General: well developed, well  nourished, seated, in no evident distress Head: head  normocephalic and atraumatic.  Oropharynx benign. Neck: supple with no carotid or supraclavicular bruits. Cardiovascular: regular rate and rhythm, no murmurs  Neurologic Exam   Mental Status: Awake and fully alert.  Oriented to place and time.  Recent and remote memory intact.  Attention span, concentration, and fund of knowledge appropriate.  Mood and affect appropriate. Cranial Nerves:  Pupils equal, briskly reactive to light.  Extraocular movements full without nystagmus.   bilateral corneal reflexes were normal and symmetric, Visual fields full to confrontation.  Hearing intact and symmetric to finger snap.  Facial sensation intact.  Face, tongue, palate move normally and symmetrically.  No hypersensitivity noted in the teeth or the upper lip.  Motor: Normal bulk and tone.  Normal strength in all tested extremity muscles. No tremor. Sensory: Intact to touch and temperature, pinprick and vibratory in all extremities. Coordination: Rapid alternating movements normal in all extremities.  Finger-to-nose and heel-to-shin performed accurately bilaterally. Gait and Station: Arises from chair without difficulty.  Stance is normal.  Gait demonstrates normal stride length and balance.  Able to heel, toe, and tandem walk without difficulty. Reflexes: 2+ and symmetric.  Toes downgoing.   Assessment  and plan:   68 years old [place] female, with past medical history of right facial pain, consistent with trigeminal neuralgia.  1 tapering down gabapentin , new prescription 300 mg tablets were provided, she may try taper off gabapentin 2 return to clinic in 12 months with Kathryn Jonesarolyn prn

## 2013-07-27 NOTE — Patient Instructions (Signed)
She is taking Gabapentin 800mg  three times a day,   Decrease to Gabbapentin 300mg  ii three times a day xone week, then 300mg  i tab three times a day, xone week, then twice a day xone week, then stop

## 2013-08-10 ENCOUNTER — Other Ambulatory Visit (HOSPITAL_COMMUNITY): Payer: Self-pay | Admitting: Family Medicine

## 2013-08-10 DIAGNOSIS — Z1231 Encounter for screening mammogram for malignant neoplasm of breast: Secondary | ICD-10-CM

## 2013-10-05 ENCOUNTER — Other Ambulatory Visit: Payer: Self-pay | Admitting: Neurology

## 2013-10-05 NOTE — Telephone Encounter (Signed)
Per last OV note in Jan :  Decrease to Gabbapentin 300mg  ii three times a day xone week, then 300mg  i tab three times a day, xone week, then twice a day xone week, then stop

## 2013-10-07 ENCOUNTER — Ambulatory Visit (HOSPITAL_COMMUNITY)
Admission: RE | Admit: 2013-10-07 | Discharge: 2013-10-07 | Disposition: A | Discharge status: Home / Self Care | Payer: Medicare Other | Source: Ambulatory Visit | Attending: Family Medicine | Admitting: Family Medicine

## 2013-10-07 DIAGNOSIS — Z1231 Encounter for screening mammogram for malignant neoplasm of breast: Secondary | ICD-10-CM | POA: Insufficient documentation

## 2014-04-16 ENCOUNTER — Other Ambulatory Visit: Payer: Self-pay

## 2014-07-01 ENCOUNTER — Other Ambulatory Visit (HOSPITAL_COMMUNITY)
Admission: RE | Admit: 2014-07-01 | Discharge: 2014-07-01 | Disposition: A | Discharge status: Home / Self Care | Payer: Medicare Other | Source: Ambulatory Visit | Attending: Family Medicine | Admitting: Family Medicine

## 2014-07-01 ENCOUNTER — Other Ambulatory Visit: Payer: Self-pay | Admitting: Family Medicine

## 2014-07-01 DIAGNOSIS — Z124 Encounter for screening for malignant neoplasm of cervix: Secondary | ICD-10-CM | POA: Diagnosis present

## 2014-07-05 LAB — CYTOLOGY - PAP

## 2014-07-19 ENCOUNTER — Other Ambulatory Visit: Payer: Self-pay | Admitting: Obstetrics and Gynecology

## 2014-07-27 ENCOUNTER — Ambulatory Visit: Payer: Medicare Other | Admitting: Nurse Practitioner

## 2014-10-18 ENCOUNTER — Other Ambulatory Visit (HOSPITAL_COMMUNITY): Payer: Self-pay | Admitting: Family Medicine

## 2014-10-18 DIAGNOSIS — Z1231 Encounter for screening mammogram for malignant neoplasm of breast: Secondary | ICD-10-CM

## 2014-11-11 ENCOUNTER — Ambulatory Visit (HOSPITAL_COMMUNITY)
Admission: RE | Admit: 2014-11-11 | Discharge: 2014-11-11 | Disposition: A | Discharge status: Home / Self Care | Payer: Medicare Other | Source: Ambulatory Visit | Attending: Family Medicine | Admitting: Family Medicine

## 2014-11-11 DIAGNOSIS — Z1231 Encounter for screening mammogram for malignant neoplasm of breast: Secondary | ICD-10-CM | POA: Insufficient documentation

## 2014-12-27 ENCOUNTER — Other Ambulatory Visit: Payer: Self-pay

## 2015-07-27 ENCOUNTER — Other Ambulatory Visit: Payer: Self-pay | Admitting: Obstetrics and Gynecology

## 2015-10-12 ENCOUNTER — Other Ambulatory Visit: Payer: Self-pay

## 2015-10-12 DIAGNOSIS — Z1231 Encounter for screening mammogram for malignant neoplasm of breast: Secondary | ICD-10-CM

## 2015-11-15 ENCOUNTER — Ambulatory Visit
Admission: RE | Admit: 2015-11-15 | Discharge: 2015-11-15 | Disposition: A | Discharge status: Home / Self Care | Payer: Medicare Other | Source: Ambulatory Visit

## 2015-11-15 DIAGNOSIS — Z1231 Encounter for screening mammogram for malignant neoplasm of breast: Secondary | ICD-10-CM | POA: Diagnosis not present

## 2015-12-22 ENCOUNTER — Encounter (HOSPITAL_COMMUNITY): Payer: Self-pay | Admitting: Emergency Medicine

## 2015-12-22 ENCOUNTER — Other Ambulatory Visit: Payer: Self-pay

## 2015-12-22 ENCOUNTER — Emergency Department (HOSPITAL_COMMUNITY): Payer: Medicare Other

## 2015-12-22 ENCOUNTER — Emergency Department (HOSPITAL_COMMUNITY)
Admission: EM | Admit: 2015-12-22 | Discharge: 2015-12-22 | Disposition: A | Discharge status: Home / Self Care | Payer: Medicare Other | Attending: Emergency Medicine | Admitting: Emergency Medicine

## 2015-12-22 DIAGNOSIS — Z7982 Long term (current) use of aspirin: Secondary | ICD-10-CM | POA: Insufficient documentation

## 2015-12-22 DIAGNOSIS — Z79899 Other long term (current) drug therapy: Secondary | ICD-10-CM | POA: Insufficient documentation

## 2015-12-22 DIAGNOSIS — H5500 Unspecified nystagmus: Secondary | ICD-10-CM | POA: Insufficient documentation

## 2015-12-22 DIAGNOSIS — H81399 Other peripheral vertigo, unspecified ear: Secondary | ICD-10-CM | POA: Diagnosis not present

## 2015-12-22 DIAGNOSIS — R001 Bradycardia, unspecified: Secondary | ICD-10-CM | POA: Diagnosis not present

## 2015-12-22 DIAGNOSIS — I10 Essential (primary) hypertension: Secondary | ICD-10-CM | POA: Insufficient documentation

## 2015-12-22 DIAGNOSIS — R404 Transient alteration of awareness: Secondary | ICD-10-CM | POA: Diagnosis not present

## 2015-12-22 DIAGNOSIS — R42 Dizziness and giddiness: Secondary | ICD-10-CM | POA: Diagnosis not present

## 2015-12-22 LAB — COMPREHENSIVE METABOLIC PANEL
ALBUMIN: 4.1 g/dL (ref 3.5–5.0)
ALT: 15 U/L (ref 14–54)
ANION GAP: 10 (ref 5–15)
AST: 17 U/L (ref 15–41)
Alkaline Phosphatase: 62 U/L (ref 38–126)
BUN: 14 mg/dL (ref 6–20)
CHLORIDE: 104 mmol/L (ref 101–111)
CO2: 25 mmol/L (ref 22–32)
Calcium: 9.3 mg/dL (ref 8.9–10.3)
Creatinine, Ser: 0.86 mg/dL (ref 0.44–1.00)
GFR calc Af Amer: >60 mL/min (ref >60)
GFR calc non Af Amer: >60 mL/min (ref >60)
GLUCOSE: 138 mg/dL — AB (ref 65–99)
POTASSIUM: 3.1 mmol/L — AB (ref 3.5–5.1)
SODIUM: 139 mmol/L (ref 135–145)
Total Bilirubin: 0.8 mg/dL (ref 0.3–1.2)
Total Protein: 7.3 g/dL (ref 6.5–8.1)

## 2015-12-22 LAB — URINE MICROSCOPIC-ADD ON

## 2015-12-22 LAB — URINALYSIS, ROUTINE W REFLEX MICROSCOPIC
Glucose, UA: NEGATIVE mg/dL
Ketones, ur: >80 mg/dL — AB
Nitrite: NEGATIVE
PROTEIN: 30 mg/dL — AB
SPECIFIC GRAVITY, URINE: 1.024 (ref 1.005–1.030)
pH: 6 (ref 5.0–8.0)

## 2015-12-22 LAB — CBG MONITORING, ED: GLUCOSE-CAPILLARY: 136 mg/dL — AB (ref 65–99)

## 2015-12-22 LAB — CBC
HCT: 39.5 % (ref 36.0–46.0)
HEMOGLOBIN: 14.1 g/dL (ref 12.0–15.0)
MCH: 31.2 pg (ref 26.0–34.0)
MCHC: 35.7 g/dL (ref 30.0–36.0)
MCV: 87.4 fL (ref 78.0–100.0)
PLATELETS: 193 10*3/uL (ref 150–400)
RBC: 4.52 MIL/uL (ref 3.87–5.11)
RDW: 13.1 % (ref 11.5–15.5)
WBC: 8.8 10*3/uL (ref 4.0–10.5)

## 2015-12-22 MED ORDER — ONDANSETRON HCL 8 MG PO TABS
8.0000 mg | ORAL_TABLET | Freq: Three times a day (TID) | ORAL | Status: DC | PRN
Start: 2015-12-22 — End: 2019-09-15

## 2015-12-22 MED ORDER — STERILE WATER FOR INJECTION IV SOLN: Freq: Once | INTRAVENOUS | Status: DC

## 2015-12-22 MED ORDER — MECLIZINE HCL 25 MG PO TABS
25.0000 mg | ORAL_TABLET | Freq: Once | ORAL | Status: AC
  Administered 2015-12-22: 25 mg via ORAL
  Filled 2015-12-22: qty 1

## 2015-12-22 MED ORDER — MECLIZINE HCL 25 MG PO TABS: 25.0000 mg | ORAL_TABLET | Freq: Three times a day (TID) | ORAL | Status: DC | PRN

## 2015-12-22 MED ORDER — ONDANSETRON 8 MG PO TBDP
8.0000 mg | ORAL_TABLET | Freq: Once | ORAL | Status: AC
  Administered 2015-12-22: 8 mg via ORAL
  Filled 2015-12-22: qty 1

## 2015-12-22 NOTE — ED Notes (Signed)
Bed: [id number] Expected date:  Expected time:  Means of arrival:  Comments: 70 yo F  Dizziness

## 2015-12-22 NOTE — ED Notes (Signed)
Patient had water for po challenge.

## 2015-12-22 NOTE — ED Provider Notes (Signed)
CSN: [id number]     Arrival date & time 12/22/15  4098 History   First MD Initiated Contact with Patient 12/22/15 [phone]     Chief Complaint  Patient presents with  . Dizziness     (Consider location/radiation/quality/duration/timing/severity/associated sxs/prior Treatment) HPI   Kathryn Guerrero is a 70 y.o. female who presents for evaluation of dizziness for 3 days, associated with nausea, vomiting. No diarrhea. No headache, chest pain, weakness or paresthesias. The dizziness is a vertiginous feeling. It comes and goes and is worse with certain head movements. No head trauma. No prior similar problem. She is taking her usual medications. There are no other known modifying factors.   Past Medical History  Diagnosis Date  . Hypertension   . Facial pain   . HA (headache)   . Pain, eye, right    Past Surgical History  Procedure Laterality Date  . Cesarean section      x3   Family History  Problem Relation Age of Onset  . Pancreatic cancer Mother    Social History  Substance Use Topics  . Smoking status: Never Smoker   . Smokeless tobacco: Never Used  . Alcohol Use: 0.6 oz/week    1 Glasses of wine per week     Comment: OCC   OB History    No data available     Review of Systems  All other systems reviewed and are negative.     Allergies  Codeine and Vicodin  Home Medications   Prior to Admission medications   Medication Sig Start Date End Date Taking? Authorizing Provider  aspirin EC 81 MG tablet Take 81 mg by mouth daily.   Yes Historical Provider, MD  Biotin 1000 MCG tablet Take 1,000 mcg by mouth daily.   Yes Historical Provider, MD  calcium-vitamin D (OSCAL WITH D) 500-200 MG-UNIT per tablet Take 1 tablet by mouth 2 (two) times daily.    Yes Historical Provider, MD  ketotifen (ZADITOR) 0.025 % ophthalmic solution Place 1-2 drops into the right eye 2 (two) times daily as needed (for dry eyes).   Yes Historical Provider, MD  losartan-hydrochlorothiazide  (HYZAAR) 50-12.5 MG tablet Take 1 tablet by mouth daily.   Yes Historical Provider, MD  mometasone (NASONEX) 50 MCG/ACT nasal spray Place 2 sprays into the nose daily as needed (for allergies).   Yes Historical Provider, MD  potassium chloride (KLOR-CON 10) 10 MEQ tablet Take 10 mEq by mouth daily.   Yes Historical Provider, MD  vitamin B-12 (CYANOCOBALAMIN) 500 MCG tablet Take 500 mcg by mouth daily.   Yes Historical Provider, MD  gabapentin (NEURONTIN) 300 MG capsule Decrease to two three times a day xone week, then one three times a day, xone week, then twice a day xone week, then stop Patient not taking: Reported on 12/22/2015 10/05/13   Levert Feinstein, MD  gabapentin (NEURONTIN) 800 MG tablet Take 1 tablet (800 mg total) by mouth 3 (three) times daily. Patient not taking: Reported on 12/22/2015 01/15/13   Levert Feinstein, MD  meclizine (ANTIVERT) 25 MG tablet Take 1 tablet (25 mg total) by mouth 3 (three) times daily as needed for dizziness. 12/22/15   Mancel Bale, MD  ondansetron (ZOFRAN) 8 MG tablet Take 1 tablet (8 mg total) by mouth every 8 (eight) hours as needed for nausea or vomiting. 12/22/15   Mancel Bale, MD   BP 137/69 mmHg  Pulse 64  Temp(Src) 98.1 F (36.7 C) (Oral)  Resp 13  SpO2 97% Physical Exam  Constitutional: She is oriented to person, place, and time. She appears well-developed and well-nourished. No distress.  HENT:  Head: Normocephalic and atraumatic.  Right Ear: External ear normal.  Left Ear: External ear normal.  Eyes: Conjunctivae and EOM are normal. Pupils are equal, round, and reactive to light.  Neck: Normal range of motion and phonation normal. Neck supple.  Cardiovascular: Normal rate, regular rhythm and normal heart sounds.   Pulmonary/Chest: Effort normal and breath sounds normal. She exhibits no bony tenderness.  Abdominal: Soft. There is no tenderness.  Musculoskeletal: Normal range of motion.  Neurological: She is alert and oriented to person, place, and time.  No cranial nerve deficit or sensory deficit. She exhibits normal muscle tone. Coordination normal.  No dysarthria, aphasia, or cerebellar dysfunction. Mild left beating nystagmus, negative head impulse testing.  Skin: Skin is warm, dry and intact.  Psychiatric: She has a normal mood and affect. Her behavior is normal. Judgment and thought content normal.  Nursing note and vitals reviewed.   ED Course  Procedures (including critical care time)    Medications  meclizine (ANTIVERT) tablet 25 mg (25 mg Oral Given 12/22/15 0726)  ondansetron (ZOFRAN-ODT) disintegrating tablet 8 mg (8 mg Oral Given 12/22/15 0823)    Patient Vitals for the past 24 hrs:  BP Temp Temp src Pulse Resp SpO2  12/22/15 1130 137/69 mmHg - - 64 13 97 %  12/22/15 0900 147/68 mmHg - - 60 15 92 %  12/22/15 0828 147/70 mmHg - - 64 - -  12/22/15 0826 163/73 mmHg - - (!) 59 - -  12/22/15 0824 162/57 mmHg - - (!) 57 - -  12/22/15 0800 151/73 mmHg - - 72 22 99 %  12/22/15 0730 150/71 mmHg - - (!) 46 10 96 %  12/22/15 0635 165/70 mmHg 98.1 F (36.7 C) Oral 79 18 94 %    At discharge- Reevaluation with update and discussion. After initial assessment and treatment, an updated evaluation reveals she is more comfortable at this time. Findings discussed with the patient and all questions answered. [name] L       Labs Review Labs Reviewed  URINALYSIS, ROUTINE W REFLEX MICROSCOPIC (NOT AT [facility]) - Abnormal; Notable for the following:    APPearance CLOUDY (*)    Hgb urine dipstick LARGE (*)    Bilirubin Urine SMALL (*)    Ketones, ur >80 (*)    Protein, ur 30 (*)    Leukocytes, UA SMALL (*)    All other components within normal limits  COMPREHENSIVE METABOLIC PANEL - Abnormal; Notable for the following:    Potassium 3.1 (*)    Glucose, Bld 138 (*)    All other components within normal limits  URINE MICROSCOPIC-ADD ON - Abnormal; Notable for the following:    Squamous Epithelial / LPF 0-5 (*)    Bacteria, UA  FEW (*)    All other components within normal limits  CBG MONITORING, ED - Abnormal; Notable for the following:    Glucose-Capillary 136 (*)    All other components within normal limits  CBC    Imaging Review Mr Brain Wo Contrast  12/22/2015  CLINICAL DATA:  Severe dizziness and vomiting for 4 days. EXAM: MRI HEAD WITHOUT CONTRAST TECHNIQUE: Multiplanar, multiecho pulse sequences of the brain and surrounding structures were obtained without intravenous contrast. COMPARISON:  Trigeminal protocol MRI 11/14/2012. Maxillofacial CT 10/29/2012. Brain/trigeminal MRI 10/10/2008. FINDINGS: There is a mildly expanded, partially empty sella. No definite acute infarct is identified. A punctate  focus of mildly increased trace diffusion signal in the lower left pons on the axial series is not confirmed on the coronal series and favored to reflect artifact. The mesial temporal lobe structures are symmetric and unchanged in appearance. Ventricles and sulci are within normal limits for age. Very mild periventricular white matter T2 hyperintensities are nonspecific but may reflect minimal chronic small vessel ischemic disease, less than is often seen in patients of this age. No intracranial hemorrhage, mass, midline shift, or extra-axial fluid collection is identified. Orbits are unremarkable. A small right sphenoid sinus mucous retention cyst is again seen. The mastoid air cells are clear. Major intracranial vascular flow voids are preserved. IMPRESSION: Partially empty sella, otherwise unremarkable appearance of the brain for age. Electronically Signed   By: Sebastian AcheAllen  Grady M.D.   On: 12/22/2015 10:50   I have personally reviewed and evaluated these images and lab results as part of my medical decision-making.   EKG Interpretation   Date/Time:  Thursday December 22 2015 07:02:02 EDT Ventricular Rate:  64 PR Interval:    QRS Duration: 78 QT Interval:  429 QTC Calculation: 443 R Axis:   74 Text Interpretation:  Sinus  rhythm Abnormal R-wave progression, early  transition Left ventricular hypertrophy Minimal ST elevation, inferior  leads since last tracing no significant change Confirmed by Keah Lamba  MD,  Kikue Gerhart ([id number]) on 12/22/2015 7:39:05 AM      MDM   Final diagnoses:  Peripheral vertigo, unspecified laterality    Peripheral vertigo, without evidence for CVA, metabolic instability or impending vascular collapse.  Nursing Notes Reviewed/ Care Coordinated Applicable Imaging Reviewed Interpretation of Laboratory Data incorporated into ED treatment  The patient appears reasonably screened and/or stabilized for discharge and I doubt any other medical condition or other [facility] requiring further screening, evaluation, or treatment in the ED at this time prior to discharge.  Plan: Home Medications- meclizine, Zofran; Home Treatments- rest, fluids; return here if the recommended treatment, does not improve the symptoms; Recommended follow up- PCP, one week    Mancel BaleElliott Capitola Ladson, MD 12/22/15 1554

## 2015-12-22 NOTE — ED Notes (Addendum)
Patient states she has had nausea and vomiting since Tuesday. Patient said that she was vomiting all day and had dizziness. Patient vomited all over floor x 2. Vomit was green colored.

## 2015-12-22 NOTE — Discharge Instructions (Signed)

## 2015-12-22 NOTE — ED Notes (Signed)
Patient transported to MRI 

## 2015-12-22 NOTE — ED Notes (Signed)
Patient complaining of dizziness x 4 days. Patient is not complaining of any pain, nausea, vomiting. Patient is from home.

## 2015-12-22 NOTE — ED Notes (Signed)
Patient has tolerated fluid challenge/drank water.

## 2015-12-23 ENCOUNTER — Telehealth: Payer: Self-pay | Admitting: Neurology

## 2015-12-23 NOTE — Telephone Encounter (Signed)
Patient reports new dizziness with nausea and throwing up, when she turned her head while lying down she became very dizzy, when she opened her eyes they darted very quickly back and forth uncontrollably that started Thursday 12/22/15.  Pt went to the ED and was dx with peripheral vertigo. An appointment was made for 02/21/16 but she would like to be seen sooner, she prefers not to see NP.   Advised that the nurse would call.

## 2015-12-26 NOTE — Telephone Encounter (Signed)
Chart reviewed, last visit was on Jan 2015, please give her a follow up appt in my next available.

## 2015-12-26 NOTE — Telephone Encounter (Signed)
Returned call to patient - she declined an earlier appointment time.

## 2015-12-26 NOTE — Telephone Encounter (Signed)
Left message on both home and cell numbers.  Dr. Terrace ArabiaYan has several available appointments this week.

## 2015-12-29 DIAGNOSIS — R319 Hematuria, unspecified: Secondary | ICD-10-CM | POA: Diagnosis not present

## 2015-12-29 DIAGNOSIS — I10 Essential (primary) hypertension: Secondary | ICD-10-CM | POA: Diagnosis not present

## 2015-12-29 DIAGNOSIS — R8290 Unspecified abnormal findings in urine: Secondary | ICD-10-CM | POA: Diagnosis not present

## 2015-12-29 DIAGNOSIS — R42 Dizziness and giddiness: Secondary | ICD-10-CM | POA: Diagnosis not present

## 2016-01-10 DIAGNOSIS — R319 Hematuria, unspecified: Secondary | ICD-10-CM | POA: Diagnosis not present

## 2016-02-21 ENCOUNTER — Ambulatory Visit: Payer: Medicare Other | Admitting: Neurology

## 2016-03-13 DIAGNOSIS — R3121 Asymptomatic microscopic hematuria: Secondary | ICD-10-CM | POA: Diagnosis not present

## 2016-03-20 DIAGNOSIS — N200 Calculus of kidney: Secondary | ICD-10-CM | POA: Diagnosis not present

## 2016-03-20 DIAGNOSIS — R3121 Asymptomatic microscopic hematuria: Secondary | ICD-10-CM | POA: Diagnosis not present

## 2016-03-26 DIAGNOSIS — H40013 Open angle with borderline findings, low risk, bilateral: Secondary | ICD-10-CM | POA: Diagnosis not present

## 2016-05-15 DIAGNOSIS — H01003 Unspecified blepharitis right eye, unspecified eyelid: Secondary | ICD-10-CM | POA: Diagnosis not present

## 2016-05-15 DIAGNOSIS — J0110 Acute frontal sinusitis, unspecified: Secondary | ICD-10-CM | POA: Diagnosis not present

## 2016-05-22 DIAGNOSIS — H04201 Unspecified epiphora, right lacrimal gland: Secondary | ICD-10-CM | POA: Diagnosis not present

## 2016-05-22 DIAGNOSIS — G500 Trigeminal neuralgia: Secondary | ICD-10-CM | POA: Diagnosis not present

## 2016-07-23 DIAGNOSIS — M8588 Other specified disorders of bone density and structure, other site: Secondary | ICD-10-CM | POA: Diagnosis not present

## 2016-07-23 DIAGNOSIS — Z0000 Encounter for general adult medical examination without abnormal findings: Secondary | ICD-10-CM | POA: Diagnosis not present

## 2016-07-23 DIAGNOSIS — R209 Unspecified disturbances of skin sensation: Secondary | ICD-10-CM | POA: Diagnosis not present

## 2016-07-23 DIAGNOSIS — G5603 Carpal tunnel syndrome, bilateral upper limbs: Secondary | ICD-10-CM | POA: Diagnosis not present

## 2016-07-23 DIAGNOSIS — R0981 Nasal congestion: Secondary | ICD-10-CM | POA: Diagnosis not present

## 2016-07-23 DIAGNOSIS — E669 Obesity, unspecified: Secondary | ICD-10-CM | POA: Diagnosis not present

## 2016-07-23 DIAGNOSIS — I10 Essential (primary) hypertension: Secondary | ICD-10-CM | POA: Diagnosis not present

## 2016-07-23 DIAGNOSIS — G500 Trigeminal neuralgia: Secondary | ICD-10-CM | POA: Diagnosis not present

## 2016-08-22 DIAGNOSIS — J310 Chronic rhinitis: Secondary | ICD-10-CM | POA: Diagnosis not present

## 2016-08-22 DIAGNOSIS — J342 Deviated nasal septum: Secondary | ICD-10-CM | POA: Diagnosis not present

## 2016-08-22 DIAGNOSIS — G501 Atypical facial pain: Secondary | ICD-10-CM | POA: Diagnosis not present

## 2016-08-22 DIAGNOSIS — J343 Hypertrophy of nasal turbinates: Secondary | ICD-10-CM | POA: Diagnosis not present

## 2016-08-27 ENCOUNTER — Other Ambulatory Visit (INDEPENDENT_AMBULATORY_CARE_PROVIDER_SITE_OTHER): Payer: Self-pay | Admitting: Otolaryngology

## 2016-08-27 DIAGNOSIS — J329 Chronic sinusitis, unspecified: Secondary | ICD-10-CM

## 2016-08-31 ENCOUNTER — Ambulatory Visit
Admission: RE | Admit: 2016-08-31 | Discharge: 2016-08-31 | Disposition: A | Discharge status: Home / Self Care | Payer: Medicare Other | Source: Ambulatory Visit | Attending: Otolaryngology | Admitting: Otolaryngology

## 2016-08-31 DIAGNOSIS — J329 Chronic sinusitis, unspecified: Secondary | ICD-10-CM

## 2016-09-12 DIAGNOSIS — J310 Chronic rhinitis: Secondary | ICD-10-CM | POA: Diagnosis not present

## 2016-09-12 DIAGNOSIS — J343 Hypertrophy of nasal turbinates: Secondary | ICD-10-CM | POA: Diagnosis not present

## 2016-09-12 DIAGNOSIS — J342 Deviated nasal septum: Secondary | ICD-10-CM | POA: Diagnosis not present

## 2016-09-26 DIAGNOSIS — H40013 Open angle with borderline findings, low risk, bilateral: Secondary | ICD-10-CM | POA: Diagnosis not present

## 2016-09-26 DIAGNOSIS — T1511XA Foreign body in conjunctival sac, right eye, initial encounter: Secondary | ICD-10-CM | POA: Diagnosis not present

## 2016-11-13 ENCOUNTER — Other Ambulatory Visit: Payer: Self-pay | Admitting: Family Medicine

## 2016-11-13 DIAGNOSIS — Z1231 Encounter for screening mammogram for malignant neoplasm of breast: Secondary | ICD-10-CM

## 2016-12-04 ENCOUNTER — Ambulatory Visit
Admission: RE | Admit: 2016-12-04 | Discharge: 2016-12-04 | Disposition: A | Discharge status: Home / Self Care | Payer: Medicare Other | Source: Ambulatory Visit | Attending: Family Medicine | Admitting: Family Medicine

## 2016-12-04 DIAGNOSIS — Z1231 Encounter for screening mammogram for malignant neoplasm of breast: Secondary | ICD-10-CM

## 2017-02-11 DIAGNOSIS — I10 Essential (primary) hypertension: Secondary | ICD-10-CM | POA: Diagnosis not present

## 2017-03-27 DIAGNOSIS — H40013 Open angle with borderline findings, low risk, bilateral: Secondary | ICD-10-CM | POA: Diagnosis not present

## 2017-08-21 DIAGNOSIS — I10 Essential (primary) hypertension: Secondary | ICD-10-CM | POA: Diagnosis not present

## 2017-08-21 DIAGNOSIS — Z1159 Encounter for screening for other viral diseases: Secondary | ICD-10-CM | POA: Diagnosis not present

## 2017-08-21 DIAGNOSIS — Z0000 Encounter for general adult medical examination without abnormal findings: Secondary | ICD-10-CM | POA: Diagnosis not present

## 2017-08-21 DIAGNOSIS — E7800 Pure hypercholesterolemia, unspecified: Secondary | ICD-10-CM | POA: Diagnosis not present

## 2017-08-21 DIAGNOSIS — R739 Hyperglycemia, unspecified: Secondary | ICD-10-CM | POA: Diagnosis not present

## 2017-11-19 ENCOUNTER — Other Ambulatory Visit: Payer: Self-pay | Admitting: Family Medicine

## 2017-11-19 DIAGNOSIS — Z1231 Encounter for screening mammogram for malignant neoplasm of breast: Secondary | ICD-10-CM

## 2017-12-02 DIAGNOSIS — M8588 Other specified disorders of bone density and structure, other site: Secondary | ICD-10-CM | POA: Diagnosis not present

## 2018-01-01 ENCOUNTER — Ambulatory Visit
Admission: RE | Admit: 2018-01-01 | Discharge: 2018-01-01 | Disposition: A | Discharge status: Home / Self Care | Payer: Medicare Other | Source: Ambulatory Visit | Attending: Family Medicine | Admitting: Family Medicine

## 2018-01-01 DIAGNOSIS — Z1231 Encounter for screening mammogram for malignant neoplasm of breast: Secondary | ICD-10-CM

## 2018-01-16 ENCOUNTER — Other Ambulatory Visit: Payer: Self-pay | Admitting: Neurology

## 2018-02-11 DIAGNOSIS — R070 Pain in throat: Secondary | ICD-10-CM | POA: Diagnosis not present

## 2018-02-11 DIAGNOSIS — K219 Gastro-esophageal reflux disease without esophagitis: Secondary | ICD-10-CM | POA: Diagnosis not present

## 2018-02-11 DIAGNOSIS — J310 Chronic rhinitis: Secondary | ICD-10-CM | POA: Diagnosis not present

## 2018-02-11 DIAGNOSIS — R1312 Dysphagia, oropharyngeal phase: Secondary | ICD-10-CM | POA: Diagnosis not present

## 2018-02-19 DIAGNOSIS — E7800 Pure hypercholesterolemia, unspecified: Secondary | ICD-10-CM | POA: Diagnosis not present

## 2018-02-19 DIAGNOSIS — I10 Essential (primary) hypertension: Secondary | ICD-10-CM | POA: Diagnosis not present

## 2018-02-19 DIAGNOSIS — R7303 Prediabetes: Secondary | ICD-10-CM | POA: Diagnosis not present

## 2018-04-15 DIAGNOSIS — J310 Chronic rhinitis: Secondary | ICD-10-CM | POA: Diagnosis not present

## 2018-04-15 DIAGNOSIS — R0982 Postnasal drip: Secondary | ICD-10-CM | POA: Diagnosis not present

## 2018-04-15 DIAGNOSIS — J343 Hypertrophy of nasal turbinates: Secondary | ICD-10-CM | POA: Diagnosis not present

## 2018-04-15 DIAGNOSIS — J342 Deviated nasal septum: Secondary | ICD-10-CM | POA: Diagnosis not present

## 2018-08-19 DIAGNOSIS — G501 Atypical facial pain: Secondary | ICD-10-CM | POA: Diagnosis not present

## 2018-08-19 DIAGNOSIS — J310 Chronic rhinitis: Secondary | ICD-10-CM | POA: Diagnosis not present

## 2018-08-19 DIAGNOSIS — J343 Hypertrophy of nasal turbinates: Secondary | ICD-10-CM | POA: Diagnosis not present

## 2018-09-09 DIAGNOSIS — Z0000 Encounter for general adult medical examination without abnormal findings: Secondary | ICD-10-CM | POA: Diagnosis not present

## 2018-09-09 DIAGNOSIS — Z1211 Encounter for screening for malignant neoplasm of colon: Secondary | ICD-10-CM | POA: Diagnosis not present

## 2018-09-09 DIAGNOSIS — M8588 Other specified disorders of bone density and structure, other site: Secondary | ICD-10-CM | POA: Diagnosis not present

## 2019-01-19 ENCOUNTER — Other Ambulatory Visit: Payer: Self-pay | Admitting: Psychiatric/Mental Health

## 2019-01-19 DIAGNOSIS — Z1231 Encounter for screening mammogram for malignant neoplasm of breast: Secondary | ICD-10-CM

## 2019-02-18 DIAGNOSIS — Z0120 Encounter for dental examination and cleaning without abnormal findings: Secondary | ICD-10-CM | POA: Diagnosis not present

## 2019-03-03 ENCOUNTER — Ambulatory Visit
Admission: RE | Admit: 2019-03-03 | Discharge: 2019-03-03 | Disposition: A | Discharge status: Home / Self Care | Payer: Medicare Other | Source: Ambulatory Visit | Attending: Psychiatric/Mental Health | Admitting: Psychiatric/Mental Health

## 2019-03-03 ENCOUNTER — Other Ambulatory Visit: Payer: Self-pay

## 2019-03-03 DIAGNOSIS — Z1231 Encounter for screening mammogram for malignant neoplasm of breast: Secondary | ICD-10-CM | POA: Diagnosis not present

## 2019-03-26 DIAGNOSIS — Z23 Encounter for immunization: Secondary | ICD-10-CM | POA: Diagnosis not present

## 2019-03-26 DIAGNOSIS — E7800 Pure hypercholesterolemia, unspecified: Secondary | ICD-10-CM | POA: Diagnosis not present

## 2019-03-26 DIAGNOSIS — I10 Essential (primary) hypertension: Secondary | ICD-10-CM | POA: Diagnosis not present

## 2019-03-26 DIAGNOSIS — Z1211 Encounter for screening for malignant neoplasm of colon: Secondary | ICD-10-CM | POA: Diagnosis not present

## 2019-03-27 DIAGNOSIS — Z1211 Encounter for screening for malignant neoplasm of colon: Secondary | ICD-10-CM | POA: Diagnosis not present

## 2019-07-03 HISTORY — PX: BREAST BIOPSY: SHX20

## 2019-07-25 ENCOUNTER — Ambulatory Visit: Payer: Medicare Other | Attending: Internal Medicine

## 2019-07-25 DIAGNOSIS — Z23 Encounter for immunization: Secondary | ICD-10-CM

## 2019-07-25 NOTE — Progress Notes (Signed)
   Covid-19 Vaccination Clinic  Name:  KHALIDAH HERBOLD    MRN: [id number] DOB: [birth year]  07/25/2019  Ms. Drollinger was observed post Covid-19 immunization for 15 minutes without incidence. She was provided with Vaccine Information Sheet and instruction to access the V-Safe system.   Ms. Stipp was instructed to call 911 with any severe reactions post vaccine: Marland Kitchen Difficulty breathing  . Swelling of your face and throat  . A fast heartbeat  . A bad rash all over your body  . Dizziness and weakness    Immunizations Administered    Name Date Dose VIS Date Route   Pfizer COVID-19 Vaccine 07/25/2019 12:47 PM 0.3 mL 06/12/2019 Intramuscular   Manufacturer: [employer], [employer]   Lot: [id number]   NDC: [address]-7

## 2019-08-10 DIAGNOSIS — G500 Trigeminal neuralgia: Secondary | ICD-10-CM | POA: Diagnosis not present

## 2019-08-15 ENCOUNTER — Ambulatory Visit: Payer: Medicare Other | Attending: Internal Medicine

## 2019-08-15 DIAGNOSIS — Z23 Encounter for immunization: Secondary | ICD-10-CM

## 2019-08-15 NOTE — Progress Notes (Signed)
   Covid-19 Vaccination Clinic  Name:  CEANA FIALA    MRN: [id number] DOB: May 17, 1946  08/15/2019  Ms. Ricklefs was observed post Covid-19 immunization for 15 minutes without incidence. She was provided with Vaccine Information Sheet and instruction to access the V-Safe system.   Ms. Rabel was instructed to call 911 with any severe reactions post vaccine: Marland Kitchen Difficulty breathing  . Swelling of your face and throat  . A fast heartbeat  . A bad rash all over your body  . Dizziness and weakness    Immunizations Administered    Name Date Dose VIS Date Route   Pfizer COVID-19 Vaccine 08/15/2019 12:01 PM 0.3 mL 06/12/2019 Intramuscular   Manufacturer: [employer], [employer]   Lot: [id number]   NDC: [address]-1

## 2019-09-09 DIAGNOSIS — R7303 Prediabetes: Secondary | ICD-10-CM | POA: Diagnosis not present

## 2019-09-09 DIAGNOSIS — Z0000 Encounter for general adult medical examination without abnormal findings: Secondary | ICD-10-CM | POA: Diagnosis not present

## 2019-09-09 DIAGNOSIS — G500 Trigeminal neuralgia: Secondary | ICD-10-CM | POA: Diagnosis not present

## 2019-09-09 DIAGNOSIS — I10 Essential (primary) hypertension: Secondary | ICD-10-CM | POA: Diagnosis not present

## 2019-09-15 ENCOUNTER — Other Ambulatory Visit: Payer: Self-pay

## 2019-09-15 ENCOUNTER — Encounter: Payer: Self-pay | Admitting: Neurology

## 2019-09-15 ENCOUNTER — Ambulatory Visit: Payer: Medicare Other | Admitting: Neurology

## 2019-09-15 VITALS — BP 129/71 | HR 86 | Temp 98.1°F | Ht 62.5 in | Wt 204.0 lb

## 2019-09-15 DIAGNOSIS — G4451 Hemicrania continua: Secondary | ICD-10-CM | POA: Insufficient documentation

## 2019-09-15 MED ORDER — RIZATRIPTAN BENZOATE 5 MG PO TBDP: 5.0000 mg | ORAL_TABLET | ORAL | 5 refills | Status: DC | PRN

## 2019-09-15 MED ORDER — DIVALPROEX SODIUM ER 500 MG PO TB24: 500.0000 mg | ORAL_TABLET | Freq: Every evening | ORAL | 5 refills | Status: DC

## 2019-09-15 MED ORDER — INDOMETHACIN 25 MG PO CAPS: 25.0000 mg | ORAL_CAPSULE | Freq: Three times a day (TID) | ORAL | 5 refills | Status: DC

## 2019-09-15 NOTE — Progress Notes (Signed)
PATIENT: Kathryn Guerrero DOB: 06-Apr-1946  Chief Complaint  Patient presents with  . Trigeminal Neuralgia    Reports worsening of right-sided facial pain. She is currently using gabapentin 300mg , one cap TID PRN and OTC Sudafed which is helping. She was last seen here in 2015.   2016 PCP    Marland Kitchen, MD     HISTORICAL  RAELEY Guerrero is a 74 year old female, seen in request by her primary care physician Dr. 66 for evaluation of persistent right facial pain, evaluation was on September 15, 2019.  I have reviewed and summarized the referring note from the referring physician.  I saw her previously in 2015,  She had a past medical history of hypertension, obstructive sleep apnea, around 2010, she developed frequent right facial pain, previously she described prickly sensation along right cheek, upper lip, during flareup, she complains of constant burning, sharp pain in the temple territory, pain is often triggered by touch, chewing, brushing her tooth, sudden positional change, she was initially diagnosed with right trigeminal neuralgia,  I personally reviewed MRI orbits with without contrast in May 2014, MRI of the brain without contrast in June 2017, there was no abnormality found  Over the years, she has tried different medications, Lyrica did not help, Tegretol 200 mg twice a day, complains of drowsiness, has been treated with gabapentin variable dosage since 2015, seems to help her the most, she has lost follow-up since her last visit in January 2015.  From 2015 to July 2020, she has occasionally flareup of her right facial pain, has seen ENT, no significant abnormality found, orthodontist, redo of two right upper root canal without helping her symptoms, she often self managed by gabapentin up to 600 mg 3 times a day, with Sudafed twice a day  She had a significant flareup since July 2020, which has been constant, she described right upper lip, nasolabial,  cheek, lower eyelid 4 out of 10 pressure pain, oftentimes exaggerated to more severe pain, as if something is poking behind her eyes, she woke up multiple times at night because of the severe pain, difficult to find a comfortable position, lasting for less than 30 minutes, often associated with right side autonomic phenomenon, tearing, rhinorrhea of right side, she was noted to have mild ptosis on the right side  She also complains of hypersensitivity of the right supraorbital region, there was no rash broke out,   REVIEW OF SYSTEMS: Full 14 system review of systems performed and notable only for as above All other review of systems were negative.  ALLERGIES: Allergies  Allergen Reactions  . Codeine Nausea And Vomiting  . Vicodin [Hydrocodone-Acetaminophen] Nausea And Vomiting    HOME MEDICATIONS: Current Outpatient Medications  Medication Sig Dispense Refill  . calcium-vitamin D (OSCAL WITH D) 500-200 MG-UNIT per tablet Take 1 tablet by mouth 2 (two) times daily.     02-24-2005 gabapentin (NEURONTIN) 300 MG capsule Take 300 mg by mouth 3 (three) times daily as needed.    . hydrochlorothiazide (HYDRODIURIL) 12.5 MG tablet Take 12.5 mg by mouth daily.    . hydrOXYzine (VISTARIL) 25 MG capsule Take 25 mg by mouth at bedtime as needed.    Marland Kitchen ibuprofen (ADVIL) 200 MG tablet Take 200 mg by mouth as needed.    Marland Kitchen ketotifen (ZADITOR) 0.025 % ophthalmic solution Place 1-2 drops into the right eye 2 (two) times daily as needed (for dry eyes).    Marland Kitchen losartan (COZAAR) 100 MG tablet Take  100 mg by mouth daily.    . phenylephrine (SUDAFED PE) 10 MG TABS tablet Take 10-20 mg by mouth daily.    . potassium chloride (KLOR-CON 10) 10 MEQ tablet Take 10 mEq by mouth daily.    . vitamin B-12 (CYANOCOBALAMIN) 1000 MCG tablet Take 1,000 mcg by mouth daily.     No current facility-administered medications for this visit.    PAST MEDICAL HISTORY: Past Medical History:  Diagnosis Date  . CTS (carpal tunnel syndrome)     bilateral  . Facial pain   . GERD (gastroesophageal reflux disease)   . HA (headache)   . Hypercholesteremia   . Hypertension   . IBS (irritable bowel syndrome)   . Osteopenia   . Pain, eye, right   . Prediabetes   . Sleep apnea   . Trigeminal neuralgia     PAST SURGICAL HISTORY: Past Surgical History:  Procedure Laterality Date  . BREAST EXCISIONAL BIOPSY Right 1987  . CESAREAN SECTION     x3  . CHOLECYSTECTOMY    . COLONOSCOPY      FAMILY HISTORY: Family History  Problem Relation Age of Onset  . Pancreatic cancer Mother   . Breast cancer Mother 21  . Other Father        sepsis  . Breast cancer Sister 33    SOCIAL HISTORY: Social History   Socioeconomic History  . Marital status: Divorced    Spouse name: Not on file  . Number of children: 3  . Years of education: masters  . Highest education level: Not on file  Occupational History  . Occupation: Surveyor, minerals w/ [employer]  Tobacco Use  . Smoking status: Never Smoker  . Smokeless tobacco: Never Used  Substance and Sexual Activity  . Alcohol use: Yes    Comment: OCC  . Drug use: No  . Sexual activity: Not on file  Other Topics Concern  . Not on file  Social History Narrative   Lives alone.   Right-handed.   12 ounces caffeine daily.   Social Determinants of Health   Financial Resource Strain:   . Difficulty of Paying Living Expenses:   Food Insecurity:   . Worried About Programme researcher, broadcasting/film/video in the Last Year:   . Barista in the Last Year:   Transportation Needs:   . Freight forwarder (Medical):   Marland Kitchen Lack of Transportation (Non-Medical):   Physical Activity:   . Days of Exercise per Week:   . Minutes of Exercise per Session:   Stress:   . Feeling of Stress :   Social Connections:   . Frequency of Communication with Friends and Family:   . Frequency of Social Gatherings with Friends and Family:   . Attends Religious Services:   . Active Member of Clubs or Organizations:   .  Attends Banker Meetings:   Marland Kitchen Marital Status:   Intimate Partner Violence:   . Fear of Current or Ex-Partner:   . Emotionally Abused:   Marland Kitchen Physically Abused:   . Sexually Abused:      PHYSICAL EXAM   Vitals:   09/15/19 0918  BP: 129/71  Pulse: 86  Temp: 98.1 F (36.7 C)  Weight: 204 lb (92.5 kg)  Height: 5' 2.5" (1.588 m)    Not recorded      Body mass index is 36.72 kg/m.  PHYSICAL EXAMNIATION:  Gen: NAD, conversant, well nourised, well groomed  Cardiovascular: Regular rate rhythm, no peripheral edema, warm, nontender. Eyes: Conjunctivae clear without exudates or hemorrhage Neck: Supple, no carotid bruits. Pulmonary: Clear to auscultation bilaterally   NEUROLOGICAL EXAM:  MENTAL STATUS: Speech:    Speech is normal; fluent and spontaneous with normal comprehension.  Cognition:     Orientation to time, place and person     Normal recent and remote memory     Normal Attention span and concentration     Normal Language, naming, repeating,spontaneous speech     Fund of knowledge   CRANIAL NERVES: CN II: Visual fields are full to confrontation. Pupils are round equal and briskly reactive to light.   CN III, IV, VI: extraocular movement are normal.  Mild right-sided static ptosis CN V: Facial sensation is intact to light touch CN VII: Face is symmetric with normal eye closure  CN VIII: Hearing is normal to causal conversation. CN IX, X: Phonation is normal. CN XI: Head turning and shoulder shrug are intact  MOTOR: There is no pronator drift of out-stretched arms. Muscle bulk and tone are normal. Muscle strength is normal.  REFLEXES: Reflexes are 2+ and symmetric at the biceps, triceps, knees, and ankles. Plantar responses are flexor.  SENSORY: Intact to light touch, pinprick and vibratory sensation are intact in fingers and toes.  COORDINATION: There is no trunk or limb dysmetria noted.  GAIT/STANCE: Posture is normal.  Gait is steady with normal steps, base, arm swing, and turning. Heel and toe walking are normal. Tandem gait is normal.  Romberg is absent.   DIAGNOSTIC DATA (LABS, IMAGING, TESTING) - I reviewed patient records, labs, notes, testing and imaging myself where available.   ASSESSMENT AND PLAN  ALI MCLAURIN is a 74 y.o. 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 Ph.D.  [facility] [address] [place], [place] [address] Ph: [phone] Fax: [phone]  CC: Glenis Smoker, MD

## 2019-09-24 DIAGNOSIS — H524 Presbyopia: Secondary | ICD-10-CM | POA: Diagnosis not present

## 2019-10-01 ENCOUNTER — Other Ambulatory Visit: Payer: Self-pay | Admitting: *Deleted

## 2019-10-01 MED ORDER — RIZATRIPTAN BENZOATE 5 MG PO TABS: ORAL_TABLET | ORAL | 5 refills | Status: DC

## 2019-10-07 ENCOUNTER — Telehealth: Payer: Self-pay | Admitting: Neurology

## 2019-10-07 NOTE — Telephone Encounter (Signed)
Phone rep checked office voicemail's,at 8:31 a.m.Scott @ [employer] [address] has called for clinical information re: pt's rizatriptan (MAXALT) 5 MG tablet. Voicemail stated this was the 3rd attempt made to obtain clinical information, please call [phone] option 5

## 2019-10-07 NOTE — Telephone Encounter (Signed)
I have not received any other calls from [facility] before today about this patient's medication. I returned the call to [facility] at [phone] (found on back of card - # below incorrect). I spoke to rep, Steward Drone, who states there are no issues with the prescription. She is showing a paid claim on 10/01/19. No further action is needed.

## 2019-10-14 ENCOUNTER — Encounter: Payer: Self-pay | Admitting: Neurology

## 2019-10-14 ENCOUNTER — Ambulatory Visit: Payer: Medicare Other | Admitting: Neurology

## 2019-10-14 ENCOUNTER — Other Ambulatory Visit: Payer: Self-pay

## 2019-10-14 VITALS — BP 145/81 | HR 73 | Temp 98.4°F | Ht 62.5 in | Wt 205.0 lb

## 2019-10-14 DIAGNOSIS — G501 Atypical facial pain: Secondary | ICD-10-CM | POA: Diagnosis not present

## 2019-10-14 DIAGNOSIS — G4451 Hemicrania continua: Secondary | ICD-10-CM | POA: Diagnosis not present

## 2019-10-14 MED ORDER — DIVALPROEX SODIUM ER 500 MG PO TB24: 500.0000 mg | ORAL_TABLET | Freq: Every evening | ORAL | 11 refills | Status: DC

## 2019-10-14 MED ORDER — OXCARBAZEPINE 150 MG PO TABS: 150.0000 mg | ORAL_TABLET | Freq: Two times a day (BID) | ORAL | 6 refills | Status: DC

## 2019-10-14 NOTE — Progress Notes (Addendum)
PATIENT: Kathryn Guerrero DOB: 03-23-1946  Chief Complaint  Patient presents with  . R. Hemicranial Continuum    Her current medications have worked to improve pain unless she is biting or chewing.      HISTORICAL  Kathryn Guerrero is a 74 year old female, seen in request by her primary care physician Dr. Garth Bigness for evaluation of persistent right facial pain, evaluation was on September 15, 2019.  I have reviewed and summarized the referring note from the referring physician.  I saw her previously in 2015,  She had a past medical history of hypertension, obstructive sleep apnea, around 2010, she developed frequent right facial pain, previously she described prickly sensation along right cheek, upper lip, during flareup, she complains of constant burning, sharp pain in the temple territory, pain is often triggered by touch, chewing, brushing her tooth, sudden positional change, she was initially diagnosed with right trigeminal neuralgia,  I personally reviewed MRI orbits with without contrast in May 2014, MRI of the brain without contrast in June 2017, there was no abnormality found  Over the years, she has tried different medications, Lyrica did not help, Tegretol 200 mg twice a day, complains of drowsiness, has been treated with gabapentin variable dosage since 2015, seems to help her the most, she has lost follow-up since her last visit in January 2015.  From 2015 to July 2020, she has occasionally flareup of her right facial pain, has seen ENT, no significant abnormality found, orthodontist, redo of two right upper root canal without helping her symptoms, she often self managed by gabapentin up to 600 mg 3 times a day, with Sudafed twice a day  She had a significant flareup since July 2020, which has been constant, she described right upper lip, nasolabial, cheek, lower eyelid 4 out of 10 pressure pain, oftentimes exaggerated to more severe pain, as if something is poking  behind her eyes, she woke up multiple times at night because of the severe pain, difficult to find a comfortable position, lasting for less than 30 minutes, often associated with right side autonomic phenomenon, tearing, rhinorrhea of right side, she was noted to have mild ptosis on the right side  She also complains of hypersensitivity of the right supraorbital region, there was no rash broke out,  UPDATE October 14 2019: She was given indomethacin 25 mg 3 times a day for her persistent right facial pain, she reported significant improvement, usually noticed improvement 30 minutes after indomethacin, she can eat, talk better, she is also taking Depakote ER 500 mg at nighttime she can sleep through night now,  However, despite her reported 90% improvement, she still has daily right facial discomfort, described it as a sharp radiating pain from upper teeth to right nose bridge, then becomes stabbing pain behind her right eye, lasting for couple minutes, usually triggered by talking, chewing, and still post significant limitation in her daily activity, she also tried Maxalt initially, when she was in severe pain, she was taking up to 2 tablets at nighttime to ease up the pain, for her to go into sleep, which was effective  She is planning on to see her dentist soon.   REVIEW OF SYSTEMS: Full 14 system review of systems performed and notable only for as above All other review of systems were negative.  ALLERGIES: Allergies  Allergen Reactions  . Codeine Nausea And Vomiting  . Vicodin [Hydrocodone-Acetaminophen] Nausea And Vomiting    HOME MEDICATIONS: Current Outpatient Medications  Medication Sig Dispense  Refill  . calcium-vitamin D (OSCAL WITH D) 500-200 MG-UNIT per tablet Take 1 tablet by mouth 2 (two) times daily.     . divalproex (DEPAKOTE ER) 500 MG 24 hr tablet Take 1 tablet (500 mg total) by mouth at bedtime. 30 tablet 5  . gabapentin (NEURONTIN) 300 MG capsule Take 300 mg by mouth 3  (three) times daily as needed.    . hydrochlorothiazide (HYDRODIURIL) 12.5 MG tablet Take 12.5 mg by mouth daily.    . hydrOXYzine (VISTARIL) 25 MG capsule Take 25 mg by mouth at bedtime as needed.    Marland Kitchen ibuprofen (ADVIL) 200 MG tablet Take 200 mg by mouth as needed.    . indomethacin (INDOCIN) 25 MG capsule Take 1 capsule (25 mg total) by mouth 3 (three) times daily with meals. 90 capsule 5  . ketotifen (ZADITOR) 0.025 % ophthalmic solution Place 1-2 drops into the right eye 2 (two) times daily as needed (for dry eyes).    Marland Kitchen losartan (COZAAR) 100 MG tablet Take 100 mg by mouth daily.    . phenylephrine (SUDAFED PE) 10 MG TABS tablet Take 10-20 mg by mouth daily.    . potassium chloride (KLOR-CON 10) 10 MEQ tablet Take 10 mEq by mouth daily.    . rizatriptan (MAXALT) 5 MG tablet Take 1 tab at onset of migraine. May repeat in 2 hrs, if needed. Max dose: 2 tabs/day or 12 tabs/month. 12 tablet 5  . vitamin B-12 (CYANOCOBALAMIN) 1000 MCG tablet Take 1,000 mcg by mouth daily.     No current facility-administered medications for this visit.    PAST MEDICAL HISTORY: Past Medical History:  Diagnosis Date  . CTS (carpal tunnel syndrome)    bilateral  . Facial pain   . GERD (gastroesophageal reflux disease)   . HA (headache)   . Hypercholesteremia   . Hypertension   . IBS (irritable bowel syndrome)   . Osteopenia   . Pain, eye, right   . Prediabetes   . Sleep apnea   . Trigeminal neuralgia     PAST SURGICAL HISTORY: Past Surgical History:  Procedure Laterality Date  . BREAST EXCISIONAL BIOPSY Right 1987  . CESAREAN SECTION     x3  . CHOLECYSTECTOMY    . COLONOSCOPY      FAMILY HISTORY: Family History  Problem Relation Age of Onset  . Pancreatic cancer Mother   . Breast cancer Mother 62  . Other Father        sepsis  . Breast cancer Sister 70    SOCIAL HISTORY: Social History   Socioeconomic History  . Marital status: Divorced    Spouse name: Not on file  . Number of  children: 3  . Years of education: masters  . Highest education level: Not on file  Occupational History  . Occupation: Surveyor, minerals w/ [employer]  Tobacco Use  . Smoking status: Never Smoker  . Smokeless tobacco: Never Used  Substance and Sexual Activity  . Alcohol use: Yes    Comment: OCC  . Drug use: No  . Sexual activity: Not on file  Other Topics Concern  . Not on file  Social History Narrative   Lives alone.   Right-handed.   12 ounces caffeine daily.   Social Determinants of Health   Financial Resource Strain:   . Difficulty of Paying Living Expenses:   Food Insecurity:   . Worried About Programme researcher, broadcasting/film/video in the Last Year:   . [employer] of Food in the  Last Year:   Transportation Needs:   . Film/video editor (Medical):   Marland Kitchen Lack of Transportation (Non-Medical):   Physical Activity:   . Days of Exercise per Week:   . Minutes of Exercise per Session:   Stress:   . Feeling of Stress :   Social Connections:   . Frequency of Communication with Friends and Family:   . Frequency of Social Gatherings with Friends and Family:   . Attends Religious Services:   . Active Member of Clubs or Organizations:   . Attends Archivist Meetings:   Marland Kitchen Marital Status:   Intimate Partner Violence:   . Fear of Current or Ex-Partner:   . Emotionally Abused:   Marland Kitchen Physically Abused:   . Sexually Abused:      PHYSICAL EXAM   Vitals:   10/14/19 1558  Temp: 98.4 F (36.9 C)  Weight: 205 lb (93 kg)  Height: 5' 2.5" (1.588 m)    Not recorded      Body mass index is 36.9 kg/m.  PHYSICAL EXAMNIATION:  Gen: NAD, conversant, well nourised, well groomed                     Cardiovascular: Regular rate rhythm, no peripheral edema, warm, nontender. Eyes: Conjunctivae clear without exudates or hemorrhage Neck: Supple, no carotid bruits. Pulmonary: Clear to auscultation bilaterally   NEUROLOGICAL EXAM:  MENTAL STATUS: Speech:    Speech is normal; fluent and  spontaneous with normal comprehension.  Cognition:     Orientation to time, place and person     Normal recent and remote memory     Normal Attention span and concentration     Normal Language, naming, repeating,spontaneous speech     Fund of knowledge   CRANIAL NERVES: CN II: Visual fields are full to confrontation. Pupils are round equal and briskly reactive to light.   CN III, IV, VI: extraocular movement are normal.  Mild right-sided static ptosis CN V: Facial sensation is intact to light touch CN VII: Face is symmetric with normal eye closure  CN VIII: Hearing is normal to causal conversation. CN IX, X: Phonation is normal. CN XI: Head turning and shoulder shrug are intact  MOTOR: There is no pronator drift of out-stretched arms. Muscle bulk and tone are normal. Muscle strength is normal.  REFLEXES: Reflexes are 2+ and symmetric at the biceps, triceps, knees, and ankles. Plantar responses are flexor.  SENSORY: Intact to light touch, pinprick and vibratory sensation are intact in fingers and toes.  COORDINATION: There is no trunk or limb dysmetria noted.  GAIT/STANCE: Posture is normal. Gait is steady with normal steps, base, arm swing, and turning. Heel and toe walking are normal. Tandem gait is normal.  Romberg is absent.   DIAGNOSTIC DATA (LABS, IMAGING, TESTING) - I reviewed patient records, labs, notes, testing and imaging myself where available.   ASSESSMENT AND PLAN  Kathryn Guerrero is a 74 y.o. female   Atypical right facial pain  Repeat MRI of the brain with without contrast, thin cuts through trigeminal ganglion.  Continue indomethacin 25 mg 3 times a day, Depakote ER 500 mg every night  Despite improvement with above treatment, she is still very bothered by her daily right facial pain, will try to add on Trileptal 150 mg twice a day   Continue to follow-up with her dentist  Get laboratory result from her primary care physician which was done in March  2021    Kathryn Guerrero  Terrace Arabia, M.D. Ph.D.  [facility] [address] [place], [place] [address] Ph: [phone] Fax: [phone]  CC: Shon Hale, MD  Addendum: Laboratory evaluation on September 09, 2019, creatinine 0.99, normal BMP, A1c was 6.3,

## 2019-10-15 ENCOUNTER — Telehealth: Payer: Self-pay | Admitting: Neurology

## 2019-10-15 NOTE — Telephone Encounter (Signed)
[employer] medicare order sent to [facility]. No auth per [employer]. They will reach out to the patient to schedule.

## 2019-11-14 ENCOUNTER — Other Ambulatory Visit: Payer: Medicare Other

## 2019-11-14 ENCOUNTER — Ambulatory Visit
Admission: RE | Admit: 2019-11-14 | Discharge: 2019-11-14 | Disposition: A | Discharge status: Home / Self Care | Payer: Medicare Other | Source: Ambulatory Visit | Attending: Neurology | Admitting: Neurology

## 2019-11-14 ENCOUNTER — Other Ambulatory Visit: Payer: Self-pay

## 2019-11-14 DIAGNOSIS — G4451 Hemicrania continua: Secondary | ICD-10-CM

## 2019-11-14 DIAGNOSIS — G501 Atypical facial pain: Secondary | ICD-10-CM | POA: Diagnosis not present

## 2019-11-14 MED ORDER — GADOBENATE DIMEGLUMINE 529 MG/ML IV SOLN
19.0000 mL | Freq: Once | INTRAVENOUS | Status: AC | PRN
  Administered 2019-11-14: 19 mL via INTRAVENOUS

## 2020-01-21 ENCOUNTER — Ambulatory Visit: Payer: Medicare Other | Admitting: Neurology

## 2020-01-21 ENCOUNTER — Encounter: Payer: Self-pay | Admitting: Neurology

## 2020-01-21 VITALS — BP 142/78 | HR 70 | Ht 62.5 in | Wt 196.0 lb

## 2020-01-21 DIAGNOSIS — G501 Atypical facial pain: Secondary | ICD-10-CM | POA: Diagnosis not present

## 2020-01-21 MED ORDER — OXCARBAZEPINE 150 MG PO TABS: 150.0000 mg | ORAL_TABLET | Freq: Two times a day (BID) | ORAL | 11 refills | Status: DC
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
## 2020-01-21 NOTE — Patient Instructions (Addendum)
Depakote (Divalporex Acid ER) 500mg    OXcaOXcarbazepine (TRILEPTAL) 150 MG tabletrbazepine (TRILEPTAL) 150 MG tablet   1st week Every night 1/2 tab + one tab  2nd week Every night 1/2 tab twice a day

## 2020-01-22 LAB — CBC WITH DIFFERENTIAL
Basophils Absolute: 0 10*3/uL (ref 0.0–0.2)
Basos: 0 %
EOS (ABSOLUTE): 0.2 10*3/uL (ref 0.0–0.4)
Eos: 2 %
Hematocrit: 41.6 % (ref 34.0–46.6)
Hemoglobin: 14.1 g/dL (ref 11.1–15.9)
Immature Grans (Abs): 0 10*3/uL (ref 0.0–0.1)
Immature Granulocytes: 0 %
Lymphocytes Absolute: 2.8 10*3/uL (ref 0.7–3.1)
Lymphs: 39 %
MCH: 31.1 pg (ref 26.6–33.0)
MCHC: 33.9 g/dL (ref 31.5–35.7)
MCV: 92 fL (ref 79–97)
Monocytes Absolute: 0.7 10*3/uL (ref 0.1–0.9)
Monocytes: 10 %
Neutrophils Absolute: 3.5 10*3/uL (ref 1.4–7.0)
Neutrophils: 49 %
RBC: 4.54 x10E6/uL (ref 3.77–5.28)
RDW: 12.9 % (ref 11.7–15.4)
WBC: 7.2 10*3/uL (ref 3.4–10.8)

## 2020-01-22 LAB — COMPREHENSIVE METABOLIC PANEL
ALT: 17 IU/L (ref 0–32)
AST: 18 IU/L (ref 0–40)
Albumin/Globulin Ratio: 1.4 (ref 1.2–2.2)
Albumin: 4.1 g/dL (ref 3.7–4.7)
Alkaline Phosphatase: 80 IU/L (ref 48–121)
BUN/Creatinine Ratio: 17 (ref 12–28)
BUN: 16 mg/dL (ref 8–27)
Bilirubin Total: 0.2 mg/dL (ref 0.0–1.2)
CO2: 29 mmol/L (ref 20–29)
Calcium: 9.4 mg/dL (ref 8.7–10.3)
Chloride: 98 mmol/L (ref 96–106)
Creatinine, Ser: 0.96 mg/dL (ref 0.57–1.00)
GFR calc Af Amer: 67 mL/min/{1.73_m2} (ref >59)
GFR calc non Af Amer: 58 mL/min/{1.73_m2} — ABNORMAL LOW (ref >59)
Globulin, Total: 2.9 g/dL (ref 1.5–4.5)
Glucose: 114 mg/dL — ABNORMAL HIGH (ref 65–99)
Potassium: 4 mmol/L (ref 3.5–5.2)
Sodium: 139 mmol/L (ref 134–144)
Total Protein: 7 g/dL (ref 6.0–8.5)

## 2020-01-22 LAB — TSH: TSH: 1.83 u[IU]/mL (ref 0.450–4.500)

## 2020-02-24 DIAGNOSIS — Z0120 Encounter for dental examination and cleaning without abnormal findings: Secondary | ICD-10-CM | POA: Diagnosis not present

## 2020-03-02 ENCOUNTER — Other Ambulatory Visit: Payer: Self-pay | Admitting: Family Medicine

## 2020-03-02 DIAGNOSIS — Z1231 Encounter for screening mammogram for malignant neoplasm of breast: Secondary | ICD-10-CM

## 2020-03-04 ENCOUNTER — Other Ambulatory Visit: Payer: Self-pay | Admitting: Family Medicine

## 2020-03-04 DIAGNOSIS — Z1231 Encounter for screening mammogram for malignant neoplasm of breast: Secondary | ICD-10-CM

## 2020-03-14 DIAGNOSIS — Z23 Encounter for immunization: Secondary | ICD-10-CM | POA: Diagnosis not present

## 2020-03-14 DIAGNOSIS — I10 Essential (primary) hypertension: Secondary | ICD-10-CM | POA: Diagnosis not present

## 2020-03-14 DIAGNOSIS — K219 Gastro-esophageal reflux disease without esophagitis: Secondary | ICD-10-CM | POA: Diagnosis not present

## 2020-03-14 DIAGNOSIS — R7303 Prediabetes: Secondary | ICD-10-CM | POA: Diagnosis not present

## 2020-03-16 ENCOUNTER — Ambulatory Visit
Admission: RE | Admit: 2020-03-16 | Discharge: 2020-03-16 | Disposition: A | Discharge status: Home / Self Care | Payer: Medicare Other | Source: Ambulatory Visit | Attending: Family Medicine | Admitting: Family Medicine

## 2020-03-16 ENCOUNTER — Other Ambulatory Visit: Payer: Self-pay

## 2020-03-16 DIAGNOSIS — Z1231 Encounter for screening mammogram for malignant neoplasm of breast: Secondary | ICD-10-CM | POA: Diagnosis not present

## 2020-03-21 ENCOUNTER — Other Ambulatory Visit: Payer: Self-pay | Admitting: Family Medicine

## 2020-03-21 DIAGNOSIS — R928 Other abnormal and inconclusive findings on diagnostic imaging of breast: Secondary | ICD-10-CM

## 2020-04-11 ENCOUNTER — Other Ambulatory Visit: Payer: Self-pay | Admitting: Family Medicine

## 2020-04-11 ENCOUNTER — Ambulatory Visit: Payer: Medicare Other

## 2020-04-11 ENCOUNTER — Ambulatory Visit
Admission: RE | Admit: 2020-04-11 | Discharge: 2020-04-11 | Disposition: A | Discharge status: Home / Self Care | Payer: Medicare Other | Source: Ambulatory Visit | Attending: Family Medicine | Admitting: Family Medicine

## 2020-04-11 ENCOUNTER — Other Ambulatory Visit: Payer: Self-pay

## 2020-04-11 DIAGNOSIS — R921 Mammographic calcification found on diagnostic imaging of breast: Secondary | ICD-10-CM | POA: Diagnosis not present

## 2020-04-11 DIAGNOSIS — N6489 Other specified disorders of breast: Secondary | ICD-10-CM | POA: Diagnosis not present

## 2020-04-11 DIAGNOSIS — R928 Other abnormal and inconclusive findings on diagnostic imaging of breast: Secondary | ICD-10-CM

## 2020-04-16 ENCOUNTER — Ambulatory Visit: Payer: Medicare Other | Attending: Internal Medicine

## 2020-04-16 DIAGNOSIS — Z23 Encounter for immunization: Secondary | ICD-10-CM

## 2020-04-16 NOTE — Progress Notes (Signed)
   Covid-19 Vaccination Clinic  Name:  Kathryn Guerrero    MRN: [id number] DOB: [birth year]  04/16/2020  [name] was observed post Covid-19 immunization for 15 minutes without incident. She was provided with Vaccine Information Sheet and instruction to access the V-Safe system.   [name] was instructed to call 911 with any severe reactions post vaccine: Marland Kitchen Difficulty breathing  . Swelling of face and throat  . A fast heartbeat  . A bad rash all over body  . Dizziness and weakness

## 2020-04-22 ENCOUNTER — Other Ambulatory Visit: Payer: Self-pay

## 2020-04-22 ENCOUNTER — Ambulatory Visit
Admission: RE | Admit: 2020-04-22 | Discharge: 2020-04-22 | Disposition: A | Discharge status: Home / Self Care | Payer: Medicare Other | Source: Ambulatory Visit | Attending: Family Medicine | Admitting: Family Medicine

## 2020-04-22 DIAGNOSIS — R921 Mammographic calcification found on diagnostic imaging of breast: Secondary | ICD-10-CM

## 2020-04-22 DIAGNOSIS — N6489 Other specified disorders of breast: Secondary | ICD-10-CM | POA: Diagnosis not present

## 2020-07-21 ENCOUNTER — Ambulatory Visit: Payer: Medicare Other | Admitting: Neurology

## 2020-08-11 DIAGNOSIS — I10 Essential (primary) hypertension: Secondary | ICD-10-CM | POA: Diagnosis not present

## 2020-09-13 DIAGNOSIS — E7800 Pure hypercholesterolemia, unspecified: Secondary | ICD-10-CM | POA: Diagnosis not present

## 2020-09-13 DIAGNOSIS — Z0000 Encounter for general adult medical examination without abnormal findings: Secondary | ICD-10-CM | POA: Diagnosis not present

## 2020-09-13 DIAGNOSIS — I10 Essential (primary) hypertension: Secondary | ICD-10-CM | POA: Diagnosis not present

## 2020-09-13 DIAGNOSIS — R7303 Prediabetes: Secondary | ICD-10-CM | POA: Diagnosis not present

## 2020-09-14 ENCOUNTER — Other Ambulatory Visit: Payer: Self-pay | Admitting: Family Medicine

## 2020-09-14 DIAGNOSIS — M8580 Other specified disorders of bone density and structure, unspecified site: Secondary | ICD-10-CM

## 2020-09-22 DIAGNOSIS — I10 Essential (primary) hypertension: Secondary | ICD-10-CM | POA: Diagnosis not present

## 2020-09-27 DIAGNOSIS — H524 Presbyopia: Secondary | ICD-10-CM | POA: Diagnosis not present

## 2021-02-02 ENCOUNTER — Other Ambulatory Visit: Payer: Self-pay | Admitting: Family Medicine

## 2021-02-02 DIAGNOSIS — Z1231 Encounter for screening mammogram for malignant neoplasm of breast: Secondary | ICD-10-CM

## 2021-03-16 DIAGNOSIS — Z23 Encounter for immunization: Secondary | ICD-10-CM | POA: Diagnosis not present

## 2021-03-16 DIAGNOSIS — E7800 Pure hypercholesterolemia, unspecified: Secondary | ICD-10-CM | POA: Diagnosis not present

## 2021-03-16 DIAGNOSIS — I10 Essential (primary) hypertension: Secondary | ICD-10-CM | POA: Diagnosis not present

## 2021-03-20 ENCOUNTER — Ambulatory Visit
Admission: RE | Admit: 2021-03-20 | Discharge: 2021-03-20 | Disposition: A | Discharge status: Home / Self Care | Payer: Medicare Other | Source: Ambulatory Visit | Attending: Family Medicine | Admitting: Family Medicine

## 2021-03-20 ENCOUNTER — Other Ambulatory Visit: Payer: Self-pay

## 2021-03-20 DIAGNOSIS — M85851 Other specified disorders of bone density and structure, right thigh: Secondary | ICD-10-CM | POA: Diagnosis not present

## 2021-03-20 DIAGNOSIS — M8580 Other specified disorders of bone density and structure, unspecified site: Secondary | ICD-10-CM

## 2021-03-20 DIAGNOSIS — Z780 Asymptomatic menopausal state: Secondary | ICD-10-CM | POA: Diagnosis not present

## 2021-03-21 IMAGING — MG MM DIGITAL SCREENING BILAT W/ TOMO W/ CAD
8 series · 8 of 24 positions shown · non-contrast
Comparison: Previous exam(s).

CLINICAL DATA: Screening.

EXAM:
DIGITAL SCREENING BILATERAL MAMMOGRAM WITH TOMO AND CAD

[L MLO synth-2D]
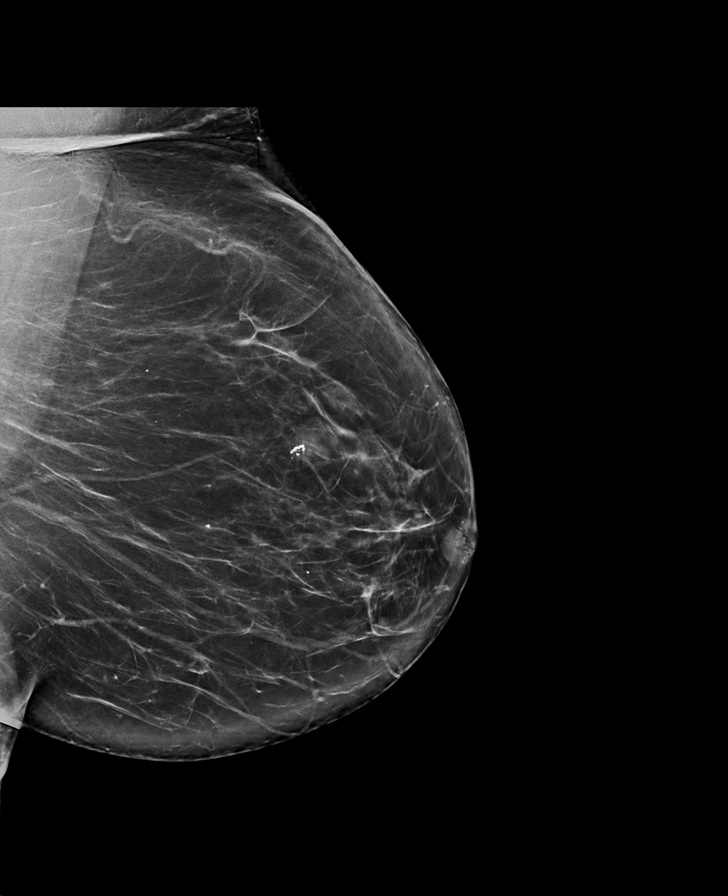

[L CC synth-2D]
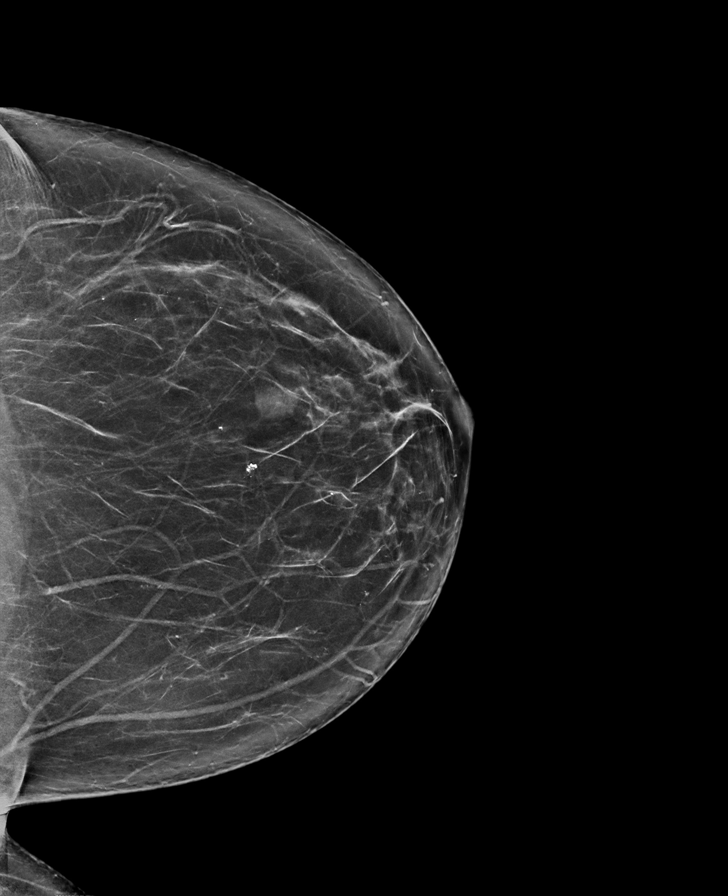

[R CC synth-2D]
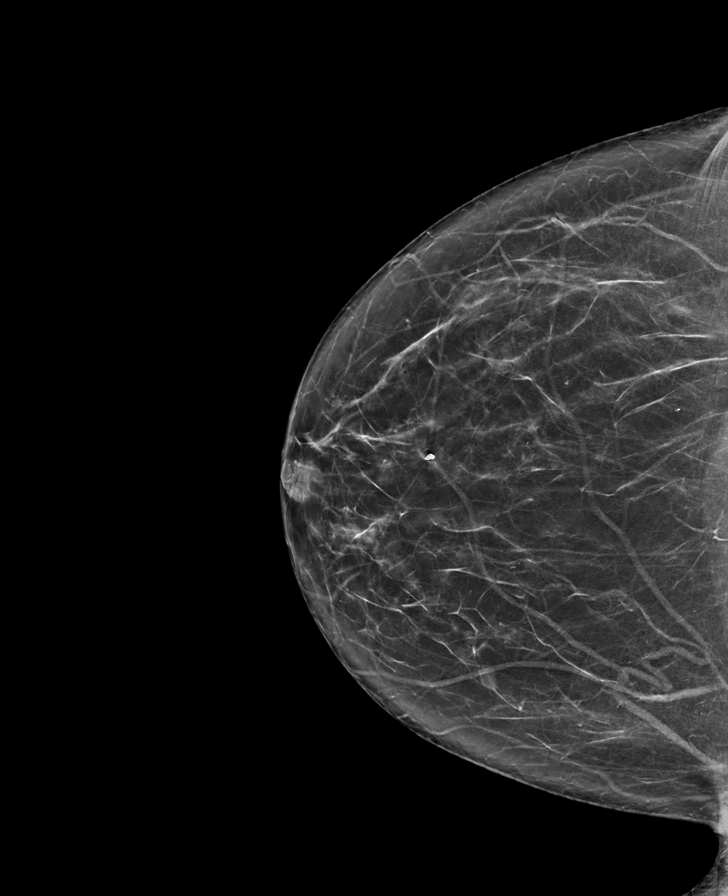

[R MLO synth-2D]
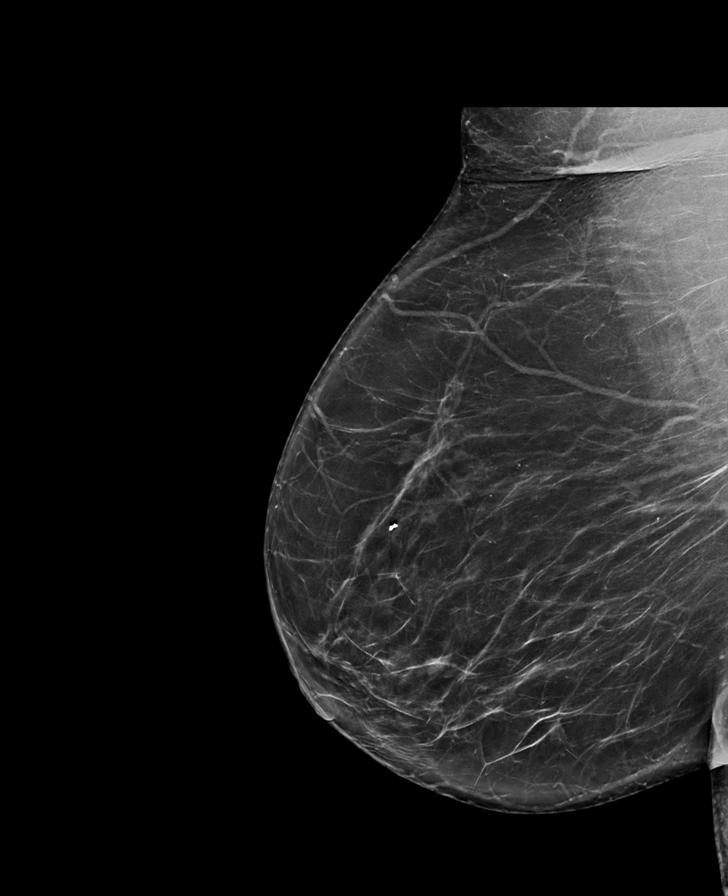

[L MLO tomo · tomo slice 43/86.0]
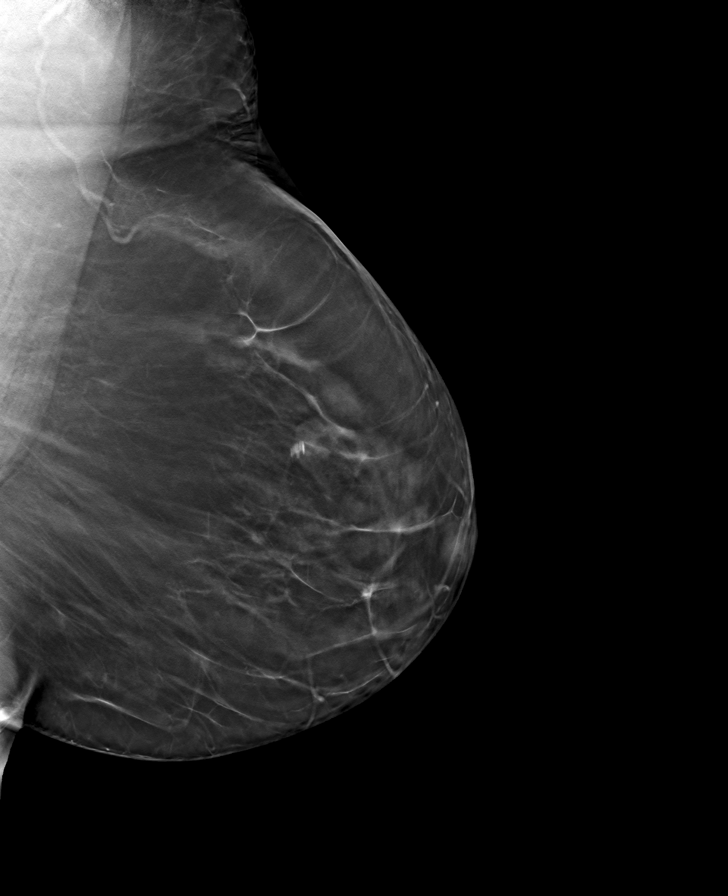

[L CC tomo · tomo slice 37/74.0]
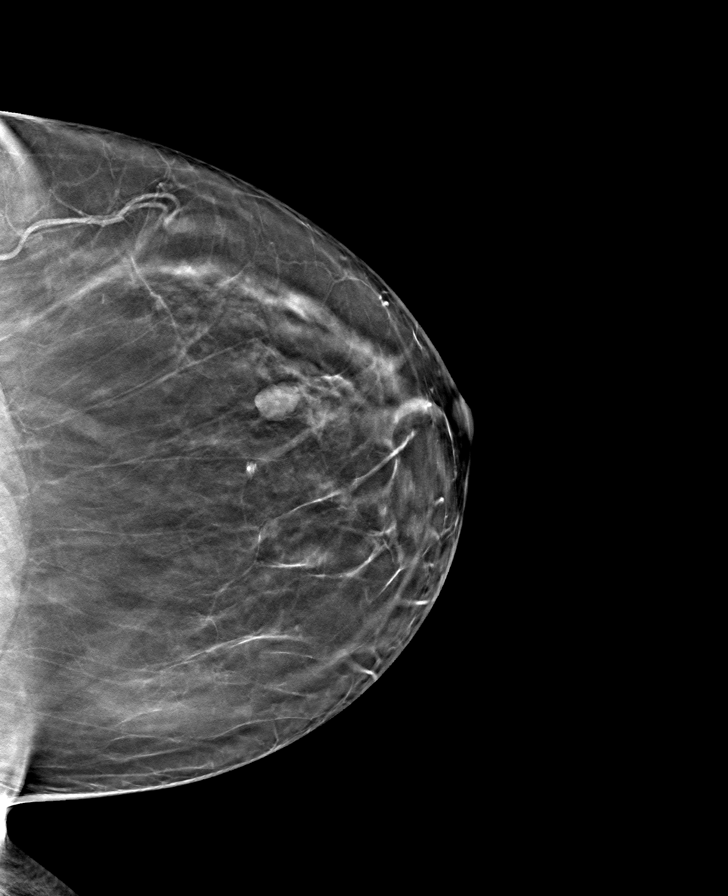

[R MLO tomo · tomo slice 39/78.0]
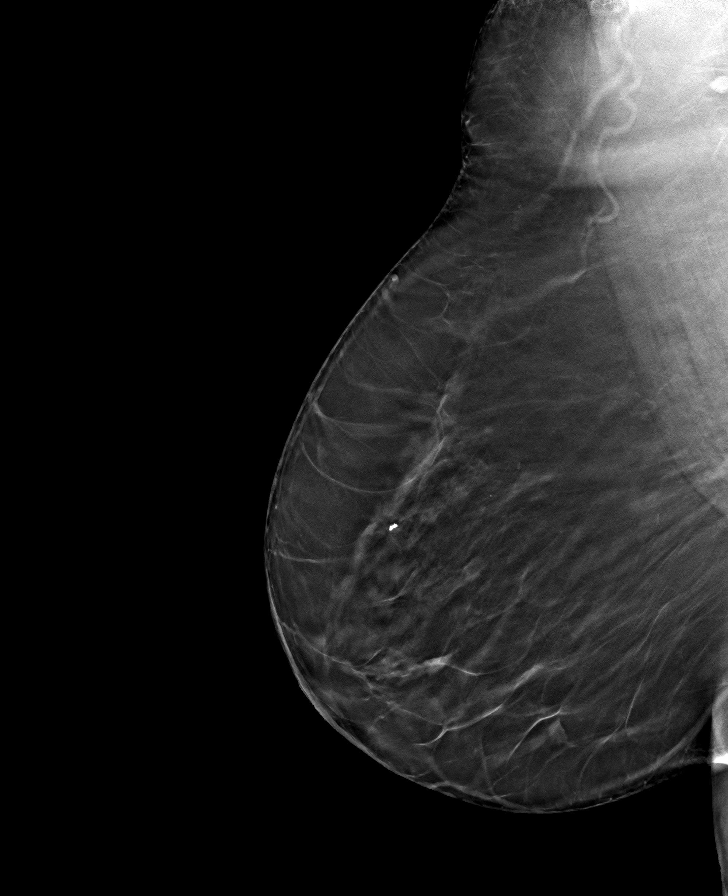

[R CC tomo · tomo slice 35/68.0]
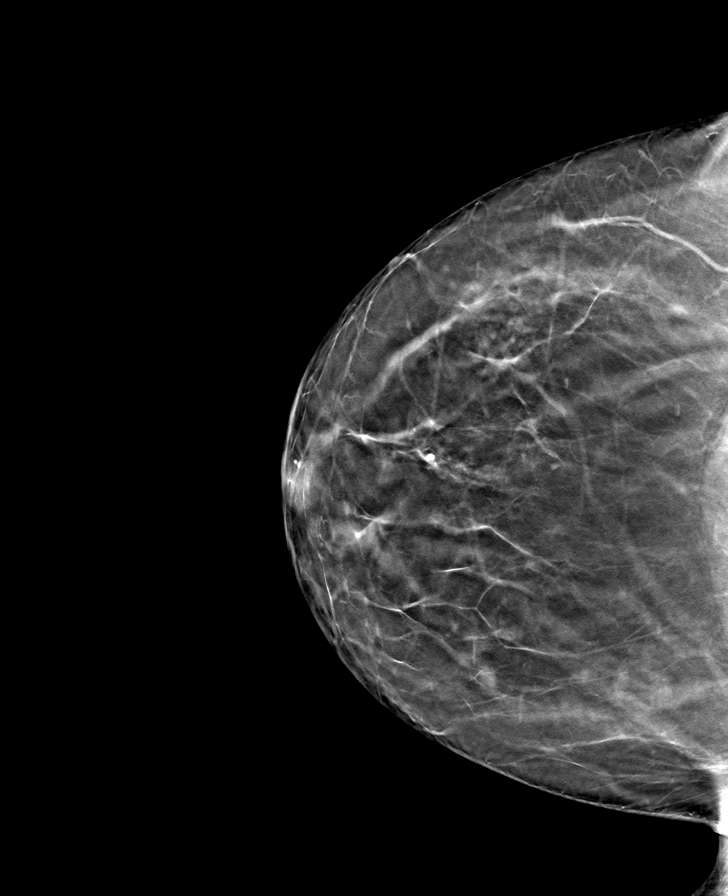

[8 of 24 positions shown; findings below may reference images not displayed]

ACR Breast Density Category b: There are scattered areas of
fibroglandular density.
FINDINGS: There are no findings suspicious for malignancy. Images were
processed with CAD.
IMPRESSION: No mammographic evidence of malignancy. A result letter of this
screening mammogram will be mailed directly to the patient.

RECOMMENDATION:
Screening mammogram in one year. (Code:[id number]-775)

BI-RADS CATEGORY  1: Negative.

## 2021-04-28 ENCOUNTER — Ambulatory Visit
Admission: RE | Admit: 2021-04-28 | Discharge: 2021-04-28 | Disposition: A | Discharge status: Home / Self Care | Payer: Medicare Other | Source: Ambulatory Visit

## 2021-04-28 ENCOUNTER — Other Ambulatory Visit: Payer: Self-pay

## 2021-04-28 DIAGNOSIS — Z1231 Encounter for screening mammogram for malignant neoplasm of breast: Secondary | ICD-10-CM

## 2021-08-29 DIAGNOSIS — N939 Abnormal uterine and vaginal bleeding, unspecified: Secondary | ICD-10-CM | POA: Diagnosis not present

## 2021-09-18 DIAGNOSIS — N950 Postmenopausal bleeding: Secondary | ICD-10-CM | POA: Diagnosis not present

## 2021-09-28 ENCOUNTER — Other Ambulatory Visit: Payer: Self-pay | Admitting: Obstetrics and Gynecology

## 2021-09-28 DIAGNOSIS — H5213 Myopia, bilateral: Secondary | ICD-10-CM | POA: Diagnosis not present

## 2021-09-28 DIAGNOSIS — N840 Polyp of corpus uteri: Secondary | ICD-10-CM | POA: Diagnosis not present

## 2021-09-28 DIAGNOSIS — R9389 Abnormal findings on diagnostic imaging of other specified body structures: Secondary | ICD-10-CM | POA: Diagnosis not present

## 2021-09-28 DIAGNOSIS — N950 Postmenopausal bleeding: Secondary | ICD-10-CM | POA: Diagnosis not present

## 2021-10-02 DIAGNOSIS — E7800 Pure hypercholesterolemia, unspecified: Secondary | ICD-10-CM | POA: Diagnosis not present

## 2021-10-02 DIAGNOSIS — M8588 Other specified disorders of bone density and structure, other site: Secondary | ICD-10-CM | POA: Diagnosis not present

## 2021-10-02 DIAGNOSIS — Z0000 Encounter for general adult medical examination without abnormal findings: Secondary | ICD-10-CM | POA: Diagnosis not present

## 2021-10-02 DIAGNOSIS — I10 Essential (primary) hypertension: Secondary | ICD-10-CM | POA: Diagnosis not present

## 2021-10-02 DIAGNOSIS — R7303 Prediabetes: Secondary | ICD-10-CM | POA: Diagnosis not present

## 2021-10-16 DIAGNOSIS — N950 Postmenopausal bleeding: Secondary | ICD-10-CM | POA: Diagnosis not present

## 2021-10-16 DIAGNOSIS — N840 Polyp of corpus uteri: Secondary | ICD-10-CM | POA: Diagnosis not present

## 2021-11-07 DIAGNOSIS — N840 Polyp of corpus uteri: Secondary | ICD-10-CM | POA: Diagnosis not present

## 2021-11-07 DIAGNOSIS — N950 Postmenopausal bleeding: Secondary | ICD-10-CM | POA: Diagnosis not present

## 2021-11-20 ENCOUNTER — Encounter (HOSPITAL_BASED_OUTPATIENT_CLINIC_OR_DEPARTMENT_OTHER): Payer: Self-pay | Admitting: Obstetrics and Gynecology

## 2021-11-20 NOTE — Progress Notes (Signed)
Spoke w/ via phone for pre-op interview--- pt Lab needs dos----  [employer] , ekg  (per anes)/  pre-op orders pending            Lab results------ no COVID test -----patient states asymptomatic no test needed Arrive at ------- 1045 on 11-22-2021 NPO after MN NO Solid Food.  Clear liquids from MN until--- 0945 Med rec completed Medications to take morning of surgery ----- gabapentin, crestor, prilosec Diabetic medication ----- n/a Patient instructed no nail polish to be worn day of surgery Patient instructed to bring photo id and insurance card day of surgery Patient aware to have Driver (ride ) / caregiver for 24 hours after surgery --son, stephen Patient Special Instructions ----- n/a Pre-Op special Istructions ----- sent inbox message in epic to dr Richardson Dopp, requested orders Patient verbalized understanding of instructions that were given at this phone interview. Patient denies shortness of breath, chest pain, fever, cough at this phone interview.

## 2021-11-21 ENCOUNTER — Other Ambulatory Visit: Payer: Self-pay | Admitting: Obstetrics and Gynecology

## 2021-11-21 DIAGNOSIS — N950 Postmenopausal bleeding: Secondary | ICD-10-CM

## 2021-11-21 NOTE — H&P (Signed)
Reason for Appointment   1. Preop visit       History of Present Illness  General:          76 y/o presents for preop visit. Pt is schedule for a hysteroscopy D&C and polypectomy on Nov 22, 2021 at 2pm for the management of PMB and endomentrial polyp.        Her history is significant for the following:        she reports cramping and spotting 08/25/2021. she was sent for consultation by Dr. Chanetta Marshall. the bleeding was a bright red. spotting lasted for 7 daysl she has not notice any blood since the approximately March 2nd or 3rd. she is not sexually active.        she reports going through menopause at the age of 30. this is the first time she has had any bleeding.     Current Medications  Taking  Gabapentin 600 MG Tablet 1 capsule Orally three times a day    hydroCHLOROthiazide 25 MG Tablet 1 tablet in the morning orally once a day    Losartan Potassium 100 MG Tablet 1 tablet Orally once a day    Potassium Chloride ER 20 MEQ Tablet Extended Release 1 tablet with food Orally Once a day    Rosuvastatin Calcium 5 MG Tablet 1 tablet Orally Once a day    Calcium + D(Calcium-Vitamin D) 630-500 Tablet 1 tablet with food Orally twice a day    PriLOSEC OTC(Omeprazole Magnesium) 20 MG Tablet Delayed Release 1 tablet Orally Once a day    Vitamin B-12 1000 MCG Tablet 1 tablet Orally Once a day    Medication List reviewed and reconciled with the patient         Past Medical History        Hypertension.         Osteopenia- DEXA 2008.         IBS.         GERD/Gastritis.         Trigeminal Nerualgia.         Sleep apnea- CPAP in the past- not interested in further evaluation- 2019.         Obesity with comorbid conditions of HTN, sleep apnea ,GERD.         Carpal tunnel syndrome of right wrist.         Obesity (BMI 30.0-34.9).        Surgical History         C-section 06/20/1973         C-section 03/22/1975         C-section & tubal ligation  04/20/1981         Right breast bx. 1983         Lap Cholecystectomy 01/09/2010         colonoscopy 1027,2536         Right breast bx (tag placed) 04/2020       Family History   Father: deceased 55 yrs, Died from Sepsis   Mother: deceased 58 yrs, Died from Pancreatic Cancer, diagnosed with Hypertension   Brother 1: deceased 32 yrs, Died from unknown causes, diagnosed with Hypertension   Sister 1: deceased 80 yrs, Died from brain aneurysm   Sister 2: alive 49 yrs, diagnosed with Hypertension   Sister 3: deceased 50 yrs, Died of Metastatic Breast Cancer, diagnosed with Hypertension, Breast cancer   1 brother(s) , 3 sister(s) . 1 son(s) , 2 daughter(s) - healthy.  NEG GI FAMILY HX  H/o breast cancer- mother and sister.       Social History  General:   Tobacco use  cigarettes: Never smoked, Tobacco history last updated 11/07/2021, Vaping No. EXPOSURE TO PASSIVE SMOKE: in the past - ex husband. Alcohol: wine 2 x weekly. Caffeine: coffee 2 12 oz cups daily cups/day, tea and soda rarely. Recreational drug use: no. DIET: no particular dietary program. Exercise: Pilates 10mins 3- 5 times per week; dance video 10mins 3-5 times a week; walking daily 5-10 mins daily. Marital Status: Divorced. Children: Boys, 1, girls, 2. EDUCATION: Masters. OCCUPATION: retired Aeronautical engineercomputer systems analyst. Seat belt use: yes.      Gyn History  Sexual activity not currently sexually active.   Periods : postmenopausal.   Denies H/O LMP 10/10/2021- PMB.   Birth control BTL.   Last pap smear date 07/01/14- neg..   Last mammogram date 04/28/2021- normal.   H/O Abnormal pap smear yes.   Denies H/O STD.       OB History  Number of pregnancies Gravida 3; Para 3.   miscarriages none.   abortion none.   Pregnancy # 1 live birth, C-section delivery.   Pregnancy # 2 live birth, C-section.   Pregnancy # 3 live birth, C-section.       Allergies   Oxycodone HCl: Nausea/ Vomiting - Allergy   Codeine  (for allergy): Nausea/Vomiting - Allergy       Hospitalization/Major Diagnostic Procedure   see surg hx        Review of Systems  CONSTITUTIONAL:         Chills No. Fatigue No. Fever No. Night sweats No. Recent travel outside [place] No. Sweats No. Weight change YES.     OPHTHALMOLOGY:         Blurring of vision no. Change in vision no. Double vision no.     ENT:         Dizziness no. Nose bleeds no. Sore throat no. Teeth pain no.     ALLERGY:         Hives no.     CARDIOLOGY:         Chest pain no. High blood pressure YES. Irregular heart beat no. Leg edema no. Palpitations no.     RESPIRATORY:         Shortness of breath no. Cough no. Wheezing no.     UROLOGY:         Pain with urination no. Urinary urgency no. Urinary frequency no. Urinary incontinence no. Difficulty urinating No. Blood in urine No.     GASTROENTEROLOGY:         Abdominal pain no. Appetite change no. Bloating/belching no. Blood in stool or on toilet paper no. Change in bowel movements no. Constipation no. Diarrhea no. Difficulty swallowing no. Nausea no.     FEMALE REPRODUCTIVE:         Vulvar pain no. Vulvar rash no. Abnormal vaginal bleeding yes. Breast pain no. Nipple discharge no. Pain with intercourse no. Pelvic pain no. Unusual vaginal discharge no. Vaginal itching no.     MUSCULOSKELETAL:         Muscle aches no.     NEUROLOGY:         Headache no. Tingling/numbness yes. Weakness no.     PSYCHOLOGY:         Depression no. Anxiety no. Nervousness no. Sleep disturbances no. Suicidal ideation no .     ENDOCRINOLOGY:  Excessive thirst no. Excessive urination no. Hair loss no. Heat or cold intolerance no.     HEMATOLOGY/LYMPH:         Abnormal bleeding no. Easy bruising yes. Swollen glands no.     DERMATOLOGY:         New/changing skin lesion no. Rash no. Sores no.      Vital Signs  Wt 201.4, Wt change -19.4 lbs, Ht 61.5, BMI 37.43, Pulse sitting 80, BP sitting 152/78.     Examination  General  Examination:       CONSTITUTIONAL: alert, oriented, NAD . SKIN:  moist, warm. EYES:  Conjunctiva clear. LUNGS:  good I:E efffort noted, clear to auscultation bilaterally. HEART:  regular rate and rhythm. ABDOMEN: soft, non-tender/non-distended, bowel sounds present . FEMALE GENITOURINARY: normal external genitalia, labia - unremarkable, vagina - pink moist mucosa, no lesions or abnormal discharge, cervix - no discharge or lesions or CMT, adnexa - no masses or tenderness, uterus - nontender and normal size on palpation . EXTREMITIES:  no edema present. PSYCH:  affect normal, good eye contact.       Physical Examination  Chaperone present:         Chaperone present  Debby Freiberg 11/07/2021 11:05:54 AM > , for pelvic exam.            Assessments     1. Postmenopausal bleeding - N95.0 (Primary)   2. Endometrial polyp - N84.0     Treatment   1. Postmenopausal bleeding   Notes: d/w pt r/b/a of hysteroscopy D&C polypectomy including but not limited to infection/ bleeding damage to uterus with perforation. .. need for further surgery. pt voiced understanding and desires to proceed. she is to avoid NSAID between now and surgery. she should not eat or drink after 6 am the morning of surgery .      2. Endometrial polyp   Notes: d/w pt r/b/a of hysteroscopy D&C polypectomy including but not limited to infection/ bleeding damage to uterus with perforation. .. need for further surgery. pt voiced understanding and desires to proceed.Marland Kitchen

## 2021-11-21 NOTE — H&P (Deleted)
  The note originally documented on this encounter has been moved the the encounter in which it belongs.  

## 2021-11-22 ENCOUNTER — Ambulatory Visit (HOSPITAL_BASED_OUTPATIENT_CLINIC_OR_DEPARTMENT_OTHER)
Admission: RE | Admit: 2021-11-22 | Discharge: 2021-11-22 | Disposition: A | Discharge status: Home / Self Care | Payer: Medicare Other | Attending: Obstetrics and Gynecology | Admitting: Obstetrics and Gynecology

## 2021-11-22 ENCOUNTER — Encounter (HOSPITAL_BASED_OUTPATIENT_CLINIC_OR_DEPARTMENT_OTHER): Payer: Self-pay | Admitting: Obstetrics and Gynecology

## 2021-11-22 ENCOUNTER — Ambulatory Visit (HOSPITAL_BASED_OUTPATIENT_CLINIC_OR_DEPARTMENT_OTHER): Payer: Medicare Other | Admitting: Anesthesiology

## 2021-11-22 ENCOUNTER — Other Ambulatory Visit: Payer: Self-pay

## 2021-11-22 ENCOUNTER — Encounter (HOSPITAL_BASED_OUTPATIENT_CLINIC_OR_DEPARTMENT_OTHER)
Admission: RE | Disposition: A | Discharge status: Home / Self Care | Payer: Self-pay | Source: Home / Self Care | Attending: Obstetrics and Gynecology

## 2021-11-22 DIAGNOSIS — G4730 Sleep apnea, unspecified: Secondary | ICD-10-CM | POA: Insufficient documentation

## 2021-11-22 DIAGNOSIS — K219 Gastro-esophageal reflux disease without esophagitis: Secondary | ICD-10-CM | POA: Insufficient documentation

## 2021-11-22 DIAGNOSIS — N840 Polyp of corpus uteri: Secondary | ICD-10-CM | POA: Insufficient documentation

## 2021-11-22 DIAGNOSIS — N950 Postmenopausal bleeding: Secondary | ICD-10-CM | POA: Insufficient documentation

## 2021-11-22 DIAGNOSIS — Z01818 Encounter for other preprocedural examination: Secondary | ICD-10-CM

## 2021-11-22 DIAGNOSIS — Z79899 Other long term (current) drug therapy: Secondary | ICD-10-CM | POA: Diagnosis not present

## 2021-11-22 DIAGNOSIS — I10 Essential (primary) hypertension: Secondary | ICD-10-CM | POA: Insufficient documentation

## 2021-11-22 HISTORY — PX: DILATATION & CURETTAGE/HYSTEROSCOPY WITH MYOSURE: SHX6511

## 2021-11-22 HISTORY — DX: Obstructive sleep apnea (adult) (pediatric): G47.33

## 2021-11-22 HISTORY — DX: Hyperlipidemia, unspecified: E78.5

## 2021-11-22 HISTORY — DX: Presence of spectacles and contact lenses: Z97.3

## 2021-11-22 HISTORY — DX: Other chronic pain: G89.29

## 2021-11-22 HISTORY — DX: Postmenopausal bleeding: N95.0

## 2021-11-22 HISTORY — DX: Polyp of corpus uteri: N84.0

## 2021-11-22 HISTORY — DX: Carpal tunnel syndrome, bilateral upper limbs: G56.03

## 2021-11-22 HISTORY — DX: Headache, unspecified: R51.9

## 2021-11-22 HISTORY — DX: Other seasonal allergic rhinitis: J30.2

## 2021-11-22 LAB — POCT I-STAT, CHEM 8
BUN: 18 mg/dL (ref 8–23)
Calcium, Ion: 1.16 mmol/L (ref 1.15–1.40)
Chloride: 104 mmol/L (ref 98–111)
Creatinine, Ser: 1 mg/dL (ref 0.44–1.00)
Glucose, Bld: 107 mg/dL — ABNORMAL HIGH (ref 70–99)
HCT: 41 % (ref 36.0–46.0)
Hemoglobin: 13.9 g/dL (ref 12.0–15.0)
Potassium: 3.8 mmol/L (ref 3.5–5.1)
Sodium: 140 mmol/L (ref 135–145)
TCO2: 28 mmol/L (ref 22–32)

## 2021-11-22 SURGERY — DILATATION & CURETTAGE/HYSTEROSCOPY WITH MYOSURE
Anesthesia: General | Site: Uterus

## 2021-11-22 MED ORDER — LACTATED RINGERS IV SOLN: INTRAVENOUS | Status: DC

## 2021-11-22 MED ORDER — POVIDONE-IODINE 10 % EX SWAB: 2.0000 "application " | Freq: Once | CUTANEOUS | Status: DC

## 2021-11-22 MED ORDER — EPHEDRINE SULFATE-NACL 50-0.9 MG/10ML-% IV SOSY
PREFILLED_SYRINGE | INTRAVENOUS | Status: DC | PRN
  Administered 2021-11-22: 15 mg via INTRAVENOUS

## 2021-11-22 MED ORDER — BUPIVACAINE HCL (PF) 0.25 % IJ SOLN
INTRAMUSCULAR | Status: DC | PRN
  Administered 2021-11-22: 20 mL

## 2021-11-22 MED ORDER — ACETAMINOPHEN 500 MG PO TABS
ORAL_TABLET | ORAL | Status: AC
  Filled 2021-11-22: qty 2

## 2021-11-22 MED ORDER — IBUPROFEN 600 MG PO TABS: 600.0000 mg | ORAL_TABLET | Freq: Four times a day (QID) | ORAL | 0 refills | Status: AC | PRN

## 2021-11-22 MED ORDER — ACETAMINOPHEN 500 MG PO TABS
1000.0000 mg | ORAL_TABLET | ORAL | Status: AC
  Administered 2021-11-22: 1000 mg via ORAL

## 2021-11-22 MED ORDER — FENTANYL CITRATE (PF) 100 MCG/2ML IJ SOLN
INTRAMUSCULAR | Status: DC | PRN
  Administered 2021-11-22: 50 ug via INTRAVENOUS

## 2021-11-22 MED ORDER — DEXAMETHASONE SODIUM PHOSPHATE 10 MG/ML IJ SOLN
INTRAMUSCULAR | Status: AC
  Filled 2021-11-22: qty 1

## 2021-11-22 MED ORDER — OXYCODONE HCL 5 MG PO TABS: 5.0000 mg | ORAL_TABLET | Freq: Once | ORAL | Status: DC | PRN

## 2021-11-22 MED ORDER — OXYCODONE HCL 5 MG/5ML PO SOLN: 5.0000 mg | Freq: Once | ORAL | Status: DC | PRN

## 2021-11-22 MED ORDER — LIDOCAINE 2% (20 MG/ML) 5 ML SYRINGE
INTRAMUSCULAR | Status: DC | PRN
  Administered 2021-11-22: 40 mg via INTRAVENOUS

## 2021-11-22 MED ORDER — DEXAMETHASONE SODIUM PHOSPHATE 10 MG/ML IJ SOLN
INTRAMUSCULAR | Status: DC | PRN
  Administered 2021-11-22: 5 mg via INTRAVENOUS

## 2021-11-22 MED ORDER — EPHEDRINE 5 MG/ML INJ
INTRAVENOUS | Status: AC
  Filled 2021-11-22: qty 5

## 2021-11-22 MED ORDER — FENTANYL CITRATE (PF) 100 MCG/2ML IJ SOLN: 25.0000 ug | INTRAMUSCULAR | Status: DC | PRN

## 2021-11-22 MED ORDER — SODIUM CHLORIDE 0.9 % IR SOLN
Status: DC | PRN
  Administered 2021-11-22: 3000 mL

## 2021-11-22 MED ORDER — LIDOCAINE HCL (PF) 2 % IJ SOLN
INTRAMUSCULAR | Status: AC
  Filled 2021-11-22: qty 5

## 2021-11-22 MED ORDER — PROPOFOL 10 MG/ML IV BOLUS
INTRAVENOUS | Status: DC | PRN
  Administered 2021-11-22: 150 mg via INTRAVENOUS

## 2021-11-22 MED ORDER — FENTANYL CITRATE (PF) 100 MCG/2ML IJ SOLN
INTRAMUSCULAR | Status: AC
  Filled 2021-11-22: qty 2

## 2021-11-22 MED ORDER — ONDANSETRON HCL 4 MG/2ML IJ SOLN
INTRAMUSCULAR | Status: DC | PRN
  Administered 2021-11-22: 4 mg via INTRAVENOUS

## 2021-11-22 MED ORDER — KETOROLAC TROMETHAMINE 30 MG/ML IJ SOLN
INTRAMUSCULAR | Status: AC
  Filled 2021-11-22: qty 1

## 2021-11-22 MED ORDER — KETOROLAC TROMETHAMINE 30 MG/ML IJ SOLN
INTRAMUSCULAR | Status: DC | PRN
  Administered 2021-11-22: 30 mg via INTRAVENOUS

## 2021-11-22 MED ORDER — ONDANSETRON HCL 4 MG/2ML IJ SOLN
INTRAMUSCULAR | Status: AC
  Filled 2021-11-22: qty 2

## 2021-11-22 SURGICAL SUPPLY — 18 items
CATH ROBINSON RED A/P 16FR (CATHETERS) IMPLANT
DEVICE MYOSURE LITE (MISCELLANEOUS) IMPLANT
DEVICE MYOSURE REACH (MISCELLANEOUS) ×1 IMPLANT
DRSG TELFA 3X8 NADH (GAUZE/BANDAGES/DRESSINGS) ×2 IMPLANT
GAUZE 4X4 16PLY ~~LOC~~+RFID DBL (SPONGE) ×3 IMPLANT
GLOVE BIOGEL M 6.5 STRL (GLOVE) ×3 IMPLANT
GLOVE BIOGEL M 7.0 STRL (GLOVE) ×3 IMPLANT
GLOVE BIOGEL PI IND STRL 6.5 (GLOVE) ×4 IMPLANT
GLOVE BIOGEL PI IND STRL 7.0 (GLOVE) ×2 IMPLANT
GLOVE BIOGEL PI INDICATOR 6.5 (GLOVE) ×2
GLOVE BIOGEL PI INDICATOR 7.0 (GLOVE) ×1
GOWN STRL REUS W/TWL LRG LVL3 (GOWN DISPOSABLE) ×6 IMPLANT
KIT PROCEDURE FLUENT (KITS) ×3 IMPLANT
KIT TURNOVER CYSTO (KITS) ×3 IMPLANT
PACK VAGINAL MINOR WOMEN LF (CUSTOM PROCEDURE TRAY) ×3 IMPLANT
PAD DRESSING TELFA 3X8 NADH (GAUZE/BANDAGES/DRESSINGS) ×2 IMPLANT
PAD OB MATERNITY 4.3X12.25 (PERSONAL CARE ITEMS) ×3 IMPLANT
SEAL ROD LENS SCOPE MYOSURE (ABLATOR) ×3 IMPLANT

## 2021-11-22 NOTE — Anesthesia Procedure Notes (Signed)
Procedure Name: LMA Insertion Date/Time: 11/22/2021 12:33 PM Performed by: Francie Massing, CRNA Pre-anesthesia Checklist: Patient identified, Emergency Drugs available, Suction available and Patient being monitored Patient Re-evaluated:Patient Re-evaluated prior to induction Oxygen Delivery Method: Circle system utilized Preoxygenation: Pre-oxygenation with 100% oxygen Induction Type: IV induction Ventilation: Mask ventilation without difficulty LMA: LMA inserted LMA Size: 4.0 Number of attempts: 1 Airway Equipment and Method: Bite block Placement Confirmation: positive ETCO2 Tube secured with: Tape Dental Injury: Teeth and Oropharynx as per pre-operative assessment

## 2021-11-22 NOTE — H&P (Signed)
Date of Initial H&P: 11/21/2021  History reviewed, patient examined, no change in status, stable for surgery.  

## 2021-11-22 NOTE — Transfer of Care (Signed)
Immediate Anesthesia Transfer of Care Note  Patient: Kathryn Guerrero  Procedure(s) Performed: Procedure(s) (LRB): DILATATION & CURETTAGE/HYSTEROSCOPY WITH MYOSURE (N/A)  Patient Location: PACU  Anesthesia Type: General  Level of Consciousness: awake, oriented, sedated and patient cooperative  Airway & Oxygen Therapy: Patient Spontanous Breathing and Patient connected to face mask oxygen  Post-op Assessment: Report given to PACU RN and Post -op Vital signs reviewed and stable  Post vital signs: Reviewed and stable  Complications: No apparent anesthesia complications Last Vitals:  Vitals Value Taken Time  BP 167/79 11/22/21 1307  Temp    Pulse 77 11/22/21 1310  Resp 12 11/22/21 1310  SpO2 100 % 11/22/21 1310  Vitals shown include unvalidated device data.  Last Pain:  Vitals:   11/22/21 1115  TempSrc: Oral  PainSc: 8       Patients Stated Pain Goal: 5 (11/22/21 1115)  Complications: No notable events documented.

## 2021-11-22 NOTE — Anesthesia Postprocedure Evaluation (Signed)
Anesthesia Post Note  Patient: Kathryn Guerrero  Procedure(s) Performed: DILATATION & CURETTAGE/HYSTEROSCOPY WITH MYOSURE (Uterus)     Patient location during evaluation: PACU Anesthesia Type: General Level of consciousness: awake and alert Pain management: pain level controlled Vital Signs Assessment: post-procedure vital signs reviewed and stable Respiratory status: spontaneous breathing, nonlabored ventilation and respiratory function stable Cardiovascular status: blood pressure returned to baseline Postop Assessment: no apparent nausea or vomiting Anesthetic complications: no   No notable events documented.  Last Vitals:  Vitals:   11/22/21 1115 11/22/21 1307  BP: (!) 162/77 (!) 167/79  Pulse: 72 80  Resp: 15 15  Temp: 36.5 C (!) 36.3 C  SpO2: 99% 100%    Last Pain:  Vitals:   11/22/21 1307  TempSrc:   PainSc: 0-No pain                 Shanda Howells

## 2021-11-22 NOTE — Op Note (Signed)
11/22/2021  1:08 PM  PATIENT:  Kathryn Guerrero  76 y.o. female  PRE-OPERATIVE DIAGNOSIS:  Endometrial Polyp Postmenopausal Bleeding  POST-OPERATIVE DIAGNOSIS:  Endometrial Polyp Postmenopausal Bleeding  PROCEDURE:  Procedure(s): DILATATION & CURETTAGE/HYSTEROSCOPY WITH MYOSURE (N/A)  SURGEON:  Surgeon(s) and Role:    Gerald Leitz, MD - Primary  PHYSICIAN ASSISTANT:   ASSISTANTS: none   ANESTHESIA:   general  EBL:  5 mL   BLOOD ADMINISTERED:none  DRAINS: none   LOCAL MEDICATIONS USED:  MARCAINE     SPECIMEN:  Source of Specimen:  endometrial polyps   DISPOSITION OF SPECIMEN:  PATHOLOGY  COUNTS:  YES  TOURNIQUET:  * No tourniquets in log *  DICTATION: .Note written in EPIC  PLAN OF CARE: Discharge to home after PACU  PATIENT DISPOSITION:  PACU - hemodynamically stable.   Delay start of Pharmacological VTE agent (>24hrs) due to surgical blood loss or risk of bleeding: not applicable  Findings: normal external genitalia/ atrophic vaginal mucosa and cervix. Endometrium containing 2 endometrial polyps . Surrounding endometrium appears atrophic.   Procedure: Patient was taken to the operating room #3 at [facility] where she was placed under general anesthesia. She was placed in the dorsal lithotomy position. She was prepped and draped in the usual sterile fashion. A speculum was placed into the vaginal vault. The anterior lip of the cervix was grasped with a single-tooth tenaculum. Quarter percent Marcaine was injected at the 4 and 8:00 positions of the cervix. The cervix was then sounded to 6 cm. The cervix was dilated to approximately 4 mm.  The Mysosure reach blade was inserted through the hysteroscope. The polyps were removed within 5 mintues. There was no evidence of perforation. Hysteroscope was then removed. The single-tooth tenaculum was removed from the anterior lip of the cervix. Excellent hemostasis was noted. The speculum was removed from the patient's vagina. She  was awakened from anesthesia taken care to the recovery room awake and in stable condition. Sponge lap and needle counts were correct x2.   Saline deficit 95

## 2021-11-22 NOTE — Discharge Instructions (Signed)
NO TYLENOL PRODUCTS UNTIL AFTER 5:15 PM TODAY.    DISCHARGE INSTRUCTIONS: HYSTEROSCOPY / ENDOMETRIAL ABLATION The following instructions have been prepared to help you care for yourself upon your return home.  Personal hygiene:  Use sanitary pads for vaginal drainage, not tampons.  Shower the day after your procedure.  NO tub baths, pools or Jacuzzis for 2-3 weeks.  Wipe front to back after using the bathroom.  Activity and limitations:  Do NOT drive or operate any equipment for 24 hours. The effects of anesthesia are still present and drowsiness may result.  Do NOT rest in bed all day.  Walking is encouraged.  Walk up and down stairs slowly.  You may resume your normal activity in one to two days or as indicated by your physician. Sexual activity: NO intercourse for at least 2 weeks after the procedure, or as indicated by your Doctor.  Diet: Eat a light meal as desired this evening. You may resume your usual diet tomorrow.  Return to Work: You may resume your work activities in one to two days or as indicated by Therapist, sports.  What to expect after your surgery: Expect to have vaginal bleeding/discharge for 2-3 days and spotting for up to 10 days. It is not unusual to have soreness for up to 1-2 weeks. You may have a slight burning sensation when you urinate for the first day. Mild cramps may continue for a couple of days. You may have a regular period in 2-6 weeks.  Call your doctor for any of the following:  Excessive vaginal bleeding or clotting, saturating and changing one pad every hour.  Inability to urinate 6 hours after discharge from hospital.  Pain not relieved by pain medication.  Fever of 100.4 F or greater.  Unusual vaginal discharge or odor.     Post Anesthesia Home Care Instructions  Activity: Get plenty of rest for the remainder of the day. A responsible individual must stay with you for 24 hours following the procedure.  For the next 24 hours, DO  NOT: -Drive a car -Advertising copywriter -Drink alcoholic beverages -Take any medication unless instructed by your physician -Make any legal decisions or sign important papers.  Meals: Start with liquid foods such as gelatin or soup. Progress to regular foods as tolerated. Avoid greasy, spicy, heavy foods. If nausea and/or vomiting occur, drink only clear liquids until the nausea and/or vomiting subsides. Call your physician if vomiting continues.  Special Instructions/Symptoms: Your throat may feel dry or sore from the anesthesia or the breathing tube placed in your throat during surgery. If this causes discomfort, gargle with warm salt water. The discomfort should disappear within 24 hours.

## 2021-11-22 NOTE — Anesthesia Preprocedure Evaluation (Addendum)
Anesthesia Evaluation  Patient identified by MRN, date of birth, ID band Patient awake    Reviewed: Allergy & Precautions, NPO status , Patient's Chart, lab work & pertinent test results  History of Anesthesia Complications Negative for: history of anesthetic complications  Airway Mallampati: II  TM Distance: >3 FB Neck ROM: Full    Dental no notable dental hx.    Pulmonary sleep apnea ,    Pulmonary exam normal        Cardiovascular hypertension, Pt. on medications negative cardio ROS Normal cardiovascular exam     Neuro/Psych negative neurological ROS  negative psych ROS   GI/Hepatic Neg liver ROS, GERD  Medicated and Controlled,  Endo/Other  negative endocrine ROS  Renal/GU negative Renal ROS  negative genitourinary   Musculoskeletal negative musculoskeletal ROS (+)   Abdominal   Peds  Hematology negative hematology ROS (+)   Anesthesia Other Findings Day of surgery medications reviewed with patient.  Reproductive/Obstetrics Postmenopausal Bleeding                            Anesthesia Physical Anesthesia Plan  ASA: 2  Anesthesia Plan: General   Post-op Pain Management: Tylenol PO (pre-op)*   Induction: Intravenous  PONV Risk Score and Plan: 3 and Treatment may vary due to age or medical condition, Dexamethasone and Ondansetron  Airway Management Planned: LMA  Additional Equipment: None  Intra-op Plan:   Post-operative Plan: Extubation in OR  Informed Consent: I have reviewed the patients History and Physical, chart, labs and discussed the procedure including the risks, benefits and alternatives for the proposed anesthesia with the patient or authorized representative who has indicated his/her understanding and acceptance.     Dental advisory given  Plan Discussed with: CRNA  Anesthesia Plan Comments:        Anesthesia Quick Evaluation

## 2021-11-23 ENCOUNTER — Encounter (HOSPITAL_BASED_OUTPATIENT_CLINIC_OR_DEPARTMENT_OTHER): Payer: Self-pay | Admitting: Obstetrics and Gynecology

## 2021-11-23 LAB — SURGICAL PATHOLOGY

## 2021-11-24 ENCOUNTER — Encounter (HOSPITAL_BASED_OUTPATIENT_CLINIC_OR_DEPARTMENT_OTHER): Payer: Self-pay | Admitting: Obstetrics and Gynecology

## 2022-03-27 DIAGNOSIS — G500 Trigeminal neuralgia: Secondary | ICD-10-CM | POA: Diagnosis not present

## 2022-03-27 DIAGNOSIS — I10 Essential (primary) hypertension: Secondary | ICD-10-CM | POA: Diagnosis not present

## 2022-04-02 ENCOUNTER — Other Ambulatory Visit: Payer: Self-pay | Admitting: Family Medicine

## 2022-04-02 DIAGNOSIS — Z1231 Encounter for screening mammogram for malignant neoplasm of breast: Secondary | ICD-10-CM

## 2022-04-30 ENCOUNTER — Ambulatory Visit
Admission: RE | Admit: 2022-04-30 | Discharge: 2022-04-30 | Disposition: A | Discharge status: Home / Self Care | Payer: Medicare Other | Source: Ambulatory Visit | Attending: Family Medicine | Admitting: Family Medicine

## 2022-04-30 DIAGNOSIS — Z1231 Encounter for screening mammogram for malignant neoplasm of breast: Secondary | ICD-10-CM

## 2022-07-11 DIAGNOSIS — H524 Presbyopia: Secondary | ICD-10-CM | POA: Diagnosis not present

## 2022-07-23 DIAGNOSIS — H35373 Puckering of macula, bilateral: Secondary | ICD-10-CM | POA: Diagnosis not present

## 2022-07-23 DIAGNOSIS — H2513 Age-related nuclear cataract, bilateral: Secondary | ICD-10-CM | POA: Diagnosis not present

## 2022-07-23 DIAGNOSIS — H25013 Cortical age-related cataract, bilateral: Secondary | ICD-10-CM | POA: Diagnosis not present

## 2022-07-27 DIAGNOSIS — R519 Headache, unspecified: Secondary | ICD-10-CM | POA: Diagnosis not present
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
## 2022-08-07 DIAGNOSIS — K080 Exfoliation of teeth due to systemic causes: Secondary | ICD-10-CM | POA: Diagnosis not present

## 2022-09-16 DIAGNOSIS — R062 Wheezing: Secondary | ICD-10-CM | POA: Diagnosis not present

## 2022-09-16 DIAGNOSIS — B349 Viral infection, unspecified: Secondary | ICD-10-CM | POA: Diagnosis not present

## 2022-09-18 DIAGNOSIS — R062 Wheezing: Secondary | ICD-10-CM | POA: Diagnosis not present

## 2022-10-08 DIAGNOSIS — R739 Hyperglycemia, unspecified: Secondary | ICD-10-CM | POA: Diagnosis not present

## 2022-10-08 DIAGNOSIS — R7303 Prediabetes: Secondary | ICD-10-CM | POA: Diagnosis not present

## 2022-10-08 DIAGNOSIS — E236 Other disorders of pituitary gland: Secondary | ICD-10-CM | POA: Diagnosis not present

## 2022-10-08 DIAGNOSIS — E7800 Pure hypercholesterolemia, unspecified: Secondary | ICD-10-CM | POA: Diagnosis not present

## 2022-10-08 DIAGNOSIS — Z0000 Encounter for general adult medical examination without abnormal findings: Secondary | ICD-10-CM | POA: Diagnosis not present

## 2022-10-08 DIAGNOSIS — M8588 Other specified disorders of bone density and structure, other site: Secondary | ICD-10-CM | POA: Diagnosis not present

## 2022-10-08 DIAGNOSIS — Z23 Encounter for immunization: Secondary | ICD-10-CM | POA: Diagnosis not present

## 2022-10-08 DIAGNOSIS — I10 Essential (primary) hypertension: Secondary | ICD-10-CM | POA: Diagnosis not present

## 2022-10-15 ENCOUNTER — Other Ambulatory Visit: Payer: Self-pay | Admitting: Family Medicine

## 2022-10-15 DIAGNOSIS — E2839 Other primary ovarian failure: Secondary | ICD-10-CM

## 2022-10-15 DIAGNOSIS — M8580 Other specified disorders of bone density and structure, unspecified site: Secondary | ICD-10-CM

## 2022-10-30 DIAGNOSIS — N950 Postmenopausal bleeding: Secondary | ICD-10-CM | POA: Diagnosis not present

## 2022-11-06 DIAGNOSIS — H2512 Age-related nuclear cataract, left eye: Secondary | ICD-10-CM | POA: Diagnosis not present

## 2022-11-06 DIAGNOSIS — H52202 Unspecified astigmatism, left eye: Secondary | ICD-10-CM | POA: Diagnosis not present

## 2022-11-06 DIAGNOSIS — H269 Unspecified cataract: Secondary | ICD-10-CM | POA: Diagnosis not present

## 2022-11-06 DIAGNOSIS — H25812 Combined forms of age-related cataract, left eye: Secondary | ICD-10-CM | POA: Diagnosis not present

## 2022-11-06 DIAGNOSIS — H25012 Cortical age-related cataract, left eye: Secondary | ICD-10-CM | POA: Diagnosis not present

## 2022-11-28 ENCOUNTER — Other Ambulatory Visit: Payer: Self-pay | Admitting: Obstetrics and Gynecology

## 2022-11-28 DIAGNOSIS — N858 Other specified noninflammatory disorders of uterus: Secondary | ICD-10-CM | POA: Diagnosis not present

## 2022-11-28 DIAGNOSIS — N950 Postmenopausal bleeding: Secondary | ICD-10-CM | POA: Diagnosis not present

## 2022-12-04 DIAGNOSIS — H25011 Cortical age-related cataract, right eye: Secondary | ICD-10-CM | POA: Diagnosis not present

## 2022-12-04 DIAGNOSIS — H2511 Age-related nuclear cataract, right eye: Secondary | ICD-10-CM | POA: Diagnosis not present

## 2022-12-04 DIAGNOSIS — H25811 Combined forms of age-related cataract, right eye: Secondary | ICD-10-CM | POA: Diagnosis not present

## 2023-01-08 DIAGNOSIS — R945 Abnormal results of liver function studies: Secondary | ICD-10-CM | POA: Diagnosis not present

## 2023-02-18 DIAGNOSIS — K080 Exfoliation of teeth due to systemic causes: Secondary | ICD-10-CM | POA: Diagnosis not present

## 2023-03-18 ENCOUNTER — Other Ambulatory Visit: Payer: Self-pay | Admitting: Family Medicine

## 2023-03-18 DIAGNOSIS — Z1231 Encounter for screening mammogram for malignant neoplasm of breast: Secondary | ICD-10-CM

## 2023-04-02 DIAGNOSIS — M25561 Pain in right knee: Secondary | ICD-10-CM | POA: Diagnosis not present

## 2023-04-02 DIAGNOSIS — S83241A Other tear of medial meniscus, current injury, right knee, initial encounter: Secondary | ICD-10-CM | POA: Diagnosis not present

## 2023-04-10 DIAGNOSIS — M25561 Pain in right knee: Secondary | ICD-10-CM | POA: Diagnosis not present

## 2023-04-10 DIAGNOSIS — G500 Trigeminal neuralgia: Secondary | ICD-10-CM | POA: Diagnosis not present

## 2023-04-10 DIAGNOSIS — I10 Essential (primary) hypertension: Secondary | ICD-10-CM | POA: Diagnosis not present

## 2023-04-10 DIAGNOSIS — Z23 Encounter for immunization: Secondary | ICD-10-CM | POA: Diagnosis not present

## 2023-04-10 DIAGNOSIS — E7800 Pure hypercholesterolemia, unspecified: Secondary | ICD-10-CM | POA: Diagnosis not present

## 2023-04-10 DIAGNOSIS — R7303 Prediabetes: Secondary | ICD-10-CM | POA: Diagnosis not present

## 2023-04-16 DIAGNOSIS — M25561 Pain in right knee: Secondary | ICD-10-CM | POA: Diagnosis not present

## 2023-04-16 DIAGNOSIS — S76311A Strain of muscle, fascia and tendon of the posterior muscle group at thigh level, right thigh, initial encounter: Secondary | ICD-10-CM | POA: Diagnosis not present

## 2023-05-02 ENCOUNTER — Ambulatory Visit
Admission: RE | Admit: 2023-05-02 | Discharge: 2023-05-02 | Disposition: A | Discharge status: Home / Self Care | Payer: Medicare Other | Source: Ambulatory Visit | Attending: Family Medicine | Admitting: Family Medicine

## 2023-05-02 DIAGNOSIS — Z1231 Encounter for screening mammogram for malignant neoplasm of breast: Secondary | ICD-10-CM

## 2023-05-03 DIAGNOSIS — G500 Trigeminal neuralgia: Secondary | ICD-10-CM | POA: Diagnosis not present

## 2023-05-14 DIAGNOSIS — G500 Trigeminal neuralgia: Secondary | ICD-10-CM | POA: Diagnosis not present

## 2023-05-14 DIAGNOSIS — M1711 Unilateral primary osteoarthritis, right knee: Secondary | ICD-10-CM | POA: Diagnosis not present

## 2023-05-14 DIAGNOSIS — I10 Essential (primary) hypertension: Secondary | ICD-10-CM | POA: Diagnosis not present

## 2023-08-26 DIAGNOSIS — K080 Exfoliation of teeth due to systemic causes: Secondary | ICD-10-CM | POA: Diagnosis not present

## 2023-10-24 DIAGNOSIS — I10 Essential (primary) hypertension: Secondary | ICD-10-CM | POA: Diagnosis not present

## 2023-10-24 DIAGNOSIS — Z0000 Encounter for general adult medical examination without abnormal findings: Secondary | ICD-10-CM | POA: Diagnosis not present

## 2023-10-24 DIAGNOSIS — E7800 Pure hypercholesterolemia, unspecified: Secondary | ICD-10-CM | POA: Diagnosis not present

## 2023-10-24 DIAGNOSIS — R7303 Prediabetes: Secondary | ICD-10-CM | POA: Diagnosis not present

## 2023-10-24 DIAGNOSIS — M8588 Other specified disorders of bone density and structure, other site: Secondary | ICD-10-CM | POA: Diagnosis not present

## 2023-10-24 DIAGNOSIS — G500 Trigeminal neuralgia: Secondary | ICD-10-CM | POA: Diagnosis not present

## 2023-10-25 ENCOUNTER — Other Ambulatory Visit: Payer: Self-pay | Admitting: Family Medicine

## 2023-10-25 DIAGNOSIS — M8580 Other specified disorders of bone density and structure, unspecified site: Secondary | ICD-10-CM

## 2023-11-01 DIAGNOSIS — Z961 Presence of intraocular lens: Secondary | ICD-10-CM | POA: Diagnosis not present

## 2023-11-01 DIAGNOSIS — H26492 Other secondary cataract, left eye: Secondary | ICD-10-CM | POA: Diagnosis not present

## 2023-12-05 DIAGNOSIS — E2839 Other primary ovarian failure: Secondary | ICD-10-CM | POA: Diagnosis not present

## 2023-12-05 DIAGNOSIS — M8588 Other specified disorders of bone density and structure, other site: Secondary | ICD-10-CM | POA: Diagnosis not present

## 2023-12-05 DIAGNOSIS — N958 Other specified menopausal and perimenopausal disorders: Secondary | ICD-10-CM | POA: Diagnosis not present

## 2024-01-16 DIAGNOSIS — H26492 Other secondary cataract, left eye: Secondary | ICD-10-CM | POA: Diagnosis not present

## 2024-01-30 DIAGNOSIS — H26491 Other secondary cataract, right eye: Secondary | ICD-10-CM | POA: Diagnosis not present

## 2024-03-18 DIAGNOSIS — R200 Anesthesia of skin: Secondary | ICD-10-CM | POA: Diagnosis not present

## 2024-03-18 DIAGNOSIS — I10 Essential (primary) hypertension: Secondary | ICD-10-CM | POA: Diagnosis not present

## 2024-03-18 DIAGNOSIS — G500 Trigeminal neuralgia: Secondary | ICD-10-CM | POA: Diagnosis not present

## 2024-03-19 ENCOUNTER — Encounter: Payer: Self-pay | Admitting: Neurology

## 2024-03-19 ENCOUNTER — Other Ambulatory Visit: Payer: Self-pay | Admitting: Family Medicine

## 2024-03-19 DIAGNOSIS — Z1231 Encounter for screening mammogram for malignant neoplasm of breast: Secondary | ICD-10-CM

## 2024-03-31 DIAGNOSIS — R200 Anesthesia of skin: Secondary | ICD-10-CM | POA: Diagnosis not present

## 2024-04-24 DIAGNOSIS — R7303 Prediabetes: Secondary | ICD-10-CM | POA: Diagnosis not present

## 2024-04-24 DIAGNOSIS — G500 Trigeminal neuralgia: Secondary | ICD-10-CM | POA: Diagnosis not present

## 2024-04-24 DIAGNOSIS — I10 Essential (primary) hypertension: Secondary | ICD-10-CM | POA: Diagnosis not present

## 2024-04-24 DIAGNOSIS — Z23 Encounter for immunization: Secondary | ICD-10-CM | POA: Diagnosis not present

## 2024-05-04 ENCOUNTER — Ambulatory Visit
Admission: RE | Admit: 2024-05-04 | Discharge: 2024-05-04 | Disposition: A | Discharge status: Home / Self Care | Source: Ambulatory Visit | Attending: Family Medicine | Admitting: Family Medicine

## 2024-05-04 DIAGNOSIS — Z1231 Encounter for screening mammogram for malignant neoplasm of breast: Secondary | ICD-10-CM

## 2024-06-03 ENCOUNTER — Ambulatory Visit: Admitting: Neurology

## 2024-06-03 ENCOUNTER — Other Ambulatory Visit: Payer: Self-pay

## 2024-06-03 ENCOUNTER — Encounter: Payer: Self-pay | Admitting: Neurology

## 2024-06-03 VITALS — BP 150/74 | HR 65 | Ht 61.5 in | Wt 193.0 lb

## 2024-06-03 DIAGNOSIS — R519 Headache, unspecified: Secondary | ICD-10-CM

## 2024-06-03 DIAGNOSIS — R202 Paresthesia of skin: Secondary | ICD-10-CM

## 2024-06-03 MED ORDER — GABAPENTIN 300 MG PO CAPS: 900.0000 mg | ORAL_CAPSULE | Freq: Three times a day (TID) | ORAL | 1 refills | Status: AC

## 2024-06-03 NOTE — Patient Instructions (Signed)
Increase gabapentin 900 mg three times daily.
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
## 2024-06-11 ENCOUNTER — Ambulatory Visit: Admitting: Neurology

## 2024-06-11 DIAGNOSIS — R202 Paresthesia of skin: Secondary | ICD-10-CM | POA: Diagnosis not present

## 2024-06-11 DIAGNOSIS — G5601 Carpal tunnel syndrome, right upper limb: Secondary | ICD-10-CM

## 2024-06-11 NOTE — Procedures (Signed)
°  [facility]  [address] [place], [address]  [place], [place] [address] Tel: [phone] Fax: [phone] Test Date:  06/11/2024  Patient: Kathryn Guerrero DOB: Nov 13, 1945 Physician: Tonita Blanch, DO  Sex: Female Height: 5' 1.5 Ref Phys: Aleck Dawn, MD  ID#: [id number]   Technician:    History: This is a 78 year-old female referred for evaluation of right hand paresthesias and weakness.  NCV & EMG Findings: Extensive electrodiagnostic testing of the right upper extremity shows:  Right median sensory response shows prolonged latency (7.5 ms) and reduced amplitude (7.4 V).  Right ulnar sensory response is within normal limits. Right median motor response shows prolonged latency (7.7 ms) and reduced amplitude (1.4 mV).  Of note, there is evidence of a right Martin-Gruber anastomosis, a normal anatomic variant.  Right ulnar motor response is within normal limits.  Active on chronic motor axon loss changes are seen affecting the right abductor pollicis brevis muscle, without accompanying active denervation.   Impression: Right median neuropathy at or distal to the wrist, consistent with a clinical diagnosis of carpal tunnel syndrome.  Overall, these findings are severe in degree electrically.   ___________________________ Tonita Blanch, DO    Nerve Conduction Studies   Stim Site NR Peak (ms) Norm Peak (ms) O-P Amp (V) Norm O-P Amp  Right Median Anti Sensory (2nd Digit)  32 C  Wrist    *7.5 <3.8 *7.4 >10  Right Ulnar Anti Sensory (5th Digit)  32 C  Wrist    2.5 <3.2 19.5 >5     Stim Site NR Onset (ms) Norm Onset (ms) O-P Amp (mV) Norm O-P Amp Site1 Site2 Delta-0 (ms) Dist (cm) Vel (m/s) Norm Vel (m/s)  Right Median Motor (Abd Poll Brev)  32 C  Wrist    *7.7 <4.0 *1.4 >5 Elbow Wrist 4.7 30.0 64 >50  Elbow    12.4  1.2  Ulnar-wrist crossover Elbow 8.2 0.0    Ulnar-wrist crossover    4.2  2.5         Right Ulnar Motor (Abd Dig Minimi)  32 C  Wrist    2.1 <3.1  11.9 >7 B Elbow Wrist 3.7 21.0 57 >50  B Elbow    5.8  11.1  A Elbow B Elbow 1.2 8.0 67 >50  A Elbow    7.0  10.8          Electromyography   Side Muscle Ins.Act Fibs Fasc Recrt Amp Dur Poly Activation Comment  Right 1stDorInt Nml Nml Nml Nml Nml Nml Nml Nml N/A  Right Abd Poll Brev Nml *1+ Nml *3- *1+ *1+ *1+ Nml N/A  Right PronatorTeres Nml Nml Nml Nml Nml Nml Nml Nml N/A  Right Biceps Nml Nml Nml Nml Nml Nml Nml Nml N/A  Right Triceps Nml Nml Nml Nml Nml Nml Nml Nml N/A  Right Deltoid Nml Nml Nml Nml Nml Nml Nml Nml N/A      Waveforms:

## 2024-06-16 DIAGNOSIS — G5601 Carpal tunnel syndrome, right upper limb: Secondary | ICD-10-CM | POA: Diagnosis not present

## 2024-06-16 DIAGNOSIS — G5603 Carpal tunnel syndrome, bilateral upper limbs: Secondary | ICD-10-CM | POA: Diagnosis not present
